# Patient Record
Sex: Male | Born: 1975 | Race: White | Hispanic: No | Marital: Married | Smoking: Never smoker
Health system: Southern US, Community
[De-identification: ages and names within clinical notes are randomized; demographics above are authoritative.]

## PROBLEM LIST (undated history)

## (undated) DIAGNOSIS — F419 Anxiety disorder, unspecified: Secondary | ICD-10-CM

## (undated) DIAGNOSIS — M1990 Unspecified osteoarthritis, unspecified site: Secondary | ICD-10-CM

## (undated) DIAGNOSIS — E785 Hyperlipidemia, unspecified: Secondary | ICD-10-CM

## (undated) DIAGNOSIS — J189 Pneumonia, unspecified organism: Secondary | ICD-10-CM

## (undated) DIAGNOSIS — K219 Gastro-esophageal reflux disease without esophagitis: Secondary | ICD-10-CM

## (undated) HISTORY — DX: Unspecified osteoarthritis, unspecified site: M19.90

## (undated) HISTORY — DX: Pneumonia, unspecified organism: J18.9

## (undated) HISTORY — DX: Gastro-esophageal reflux disease without esophagitis: K21.9

## (undated) HISTORY — PX: OTHER SURGICAL HISTORY: SHX169

## (undated) HISTORY — DX: Hyperlipidemia, unspecified: E78.5

## (undated) HISTORY — DX: Anxiety disorder, unspecified: F41.9

---

## 2005-10-25 ENCOUNTER — Ambulatory Visit: Payer: Self-pay | Admitting: Family Medicine

## 2006-02-28 ENCOUNTER — Ambulatory Visit: Payer: Self-pay | Admitting: Family Medicine

## 2006-05-01 ENCOUNTER — Ambulatory Visit: Payer: Self-pay | Admitting: Family Medicine

## 2006-05-01 LAB — CONVERTED CEMR LAB: H Pylori IgG: NEGATIVE

## 2006-05-21 DIAGNOSIS — F410 Panic disorder [episodic paroxysmal anxiety] without agoraphobia: Secondary | ICD-10-CM | POA: Insufficient documentation

## 2006-05-21 HISTORY — DX: Panic disorder (episodic paroxysmal anxiety): F41.0

## 2006-05-21 HISTORY — DX: Other disorders of bilirubin metabolism: E80.6

## 2006-05-22 ENCOUNTER — Encounter (INDEPENDENT_AMBULATORY_CARE_PROVIDER_SITE_OTHER): Payer: Self-pay | Admitting: Family Medicine

## 2006-05-22 ENCOUNTER — Encounter: Payer: Self-pay | Admitting: Family Medicine

## 2006-05-22 ENCOUNTER — Ambulatory Visit: Payer: Self-pay | Admitting: Family Medicine

## 2006-05-22 DIAGNOSIS — K219 Gastro-esophageal reflux disease without esophagitis: Secondary | ICD-10-CM

## 2006-05-22 HISTORY — DX: Gastro-esophageal reflux disease without esophagitis: K21.9

## 2006-06-01 ENCOUNTER — Encounter: Payer: Self-pay | Admitting: Internal Medicine

## 2006-08-21 ENCOUNTER — Ambulatory Visit: Payer: Self-pay | Admitting: Family Medicine

## 2006-08-21 DIAGNOSIS — R1031 Right lower quadrant pain: Secondary | ICD-10-CM

## 2006-08-21 DIAGNOSIS — N453 Epididymo-orchitis: Secondary | ICD-10-CM

## 2006-08-21 HISTORY — DX: Right lower quadrant pain: R10.31

## 2006-08-21 HISTORY — DX: Epididymo-orchitis: N45.3

## 2006-08-21 LAB — CONVERTED CEMR LAB
Blood in Urine, dipstick: NEGATIVE
Glucose, Urine, Semiquant: NEGATIVE
Ketones, urine, test strip: NEGATIVE
WBC Urine, dipstick: NEGATIVE

## 2006-08-24 ENCOUNTER — Ambulatory Visit: Payer: Self-pay | Admitting: Internal Medicine

## 2006-08-27 ENCOUNTER — Telehealth (INDEPENDENT_AMBULATORY_CARE_PROVIDER_SITE_OTHER): Payer: Self-pay | Admitting: *Deleted

## 2006-12-25 ENCOUNTER — Telehealth (INDEPENDENT_AMBULATORY_CARE_PROVIDER_SITE_OTHER): Payer: Self-pay | Admitting: *Deleted

## 2007-01-02 ENCOUNTER — Telehealth (INDEPENDENT_AMBULATORY_CARE_PROVIDER_SITE_OTHER): Payer: Self-pay | Admitting: *Deleted

## 2007-02-04 ENCOUNTER — Telehealth (INDEPENDENT_AMBULATORY_CARE_PROVIDER_SITE_OTHER): Payer: Self-pay | Admitting: *Deleted

## 2007-02-05 ENCOUNTER — Ambulatory Visit: Payer: Self-pay | Admitting: Family Medicine

## 2007-10-04 ENCOUNTER — Ambulatory Visit: Payer: Self-pay | Admitting: Family Medicine

## 2007-10-04 DIAGNOSIS — Z8669 Personal history of other diseases of the nervous system and sense organs: Secondary | ICD-10-CM

## 2007-10-04 HISTORY — DX: Personal history of other diseases of the nervous system and sense organs: Z86.69

## 2007-10-17 ENCOUNTER — Encounter (INDEPENDENT_AMBULATORY_CARE_PROVIDER_SITE_OTHER): Payer: Self-pay | Admitting: *Deleted

## 2007-10-17 ENCOUNTER — Telehealth (INDEPENDENT_AMBULATORY_CARE_PROVIDER_SITE_OTHER): Payer: Self-pay | Admitting: *Deleted

## 2007-10-21 ENCOUNTER — Encounter (INDEPENDENT_AMBULATORY_CARE_PROVIDER_SITE_OTHER): Payer: Self-pay | Admitting: *Deleted

## 2008-03-17 ENCOUNTER — Telehealth (INDEPENDENT_AMBULATORY_CARE_PROVIDER_SITE_OTHER): Payer: Self-pay | Admitting: *Deleted

## 2008-03-18 ENCOUNTER — Ambulatory Visit: Payer: Self-pay | Admitting: Family Medicine

## 2008-03-18 DIAGNOSIS — E782 Mixed hyperlipidemia: Secondary | ICD-10-CM

## 2008-03-18 DIAGNOSIS — E785 Hyperlipidemia, unspecified: Secondary | ICD-10-CM

## 2008-03-18 HISTORY — DX: Mixed hyperlipidemia: E78.2

## 2008-03-18 HISTORY — DX: Hyperlipidemia, unspecified: E78.5

## 2008-04-01 ENCOUNTER — Encounter: Payer: Self-pay | Admitting: Family Medicine

## 2008-04-08 ENCOUNTER — Encounter (INDEPENDENT_AMBULATORY_CARE_PROVIDER_SITE_OTHER): Payer: Self-pay | Admitting: *Deleted

## 2008-04-22 ENCOUNTER — Telehealth: Payer: Self-pay | Admitting: Family Medicine

## 2008-05-08 ENCOUNTER — Encounter: Payer: Self-pay | Admitting: Family Medicine

## 2008-05-21 ENCOUNTER — Ambulatory Visit: Payer: Self-pay | Admitting: Family Medicine

## 2008-05-26 ENCOUNTER — Encounter: Payer: Self-pay | Admitting: Family Medicine

## 2008-08-11 ENCOUNTER — Telehealth (INDEPENDENT_AMBULATORY_CARE_PROVIDER_SITE_OTHER): Payer: Self-pay | Admitting: *Deleted

## 2008-11-02 ENCOUNTER — Ambulatory Visit: Payer: Self-pay | Admitting: Family Medicine

## 2009-03-12 ENCOUNTER — Ambulatory Visit: Payer: Self-pay | Admitting: Internal Medicine

## 2009-03-12 DIAGNOSIS — F411 Generalized anxiety disorder: Secondary | ICD-10-CM

## 2009-03-12 DIAGNOSIS — M5441 Lumbago with sciatica, right side: Secondary | ICD-10-CM | POA: Insufficient documentation

## 2009-03-12 DIAGNOSIS — M549 Dorsalgia, unspecified: Secondary | ICD-10-CM | POA: Insufficient documentation

## 2009-03-12 HISTORY — DX: Lumbago with sciatica, right side: M54.41

## 2009-03-12 HISTORY — DX: Generalized anxiety disorder: F41.1

## 2009-03-12 HISTORY — DX: Dorsalgia, unspecified: M54.9

## 2009-03-12 LAB — CONVERTED CEMR LAB
Bilirubin Urine: NEGATIVE
Blood in Urine, dipstick: NEGATIVE
Ketones, urine, test strip: NEGATIVE
Nitrite: NEGATIVE
Urobilinogen, UA: 0.2
WBC Urine, dipstick: NEGATIVE
pH: 7

## 2009-04-09 ENCOUNTER — Ambulatory Visit: Payer: Self-pay | Admitting: Internal Medicine

## 2009-04-09 ENCOUNTER — Ambulatory Visit: Payer: Self-pay | Admitting: Diagnostic Radiology

## 2009-04-09 ENCOUNTER — Emergency Department (HOSPITAL_BASED_OUTPATIENT_CLINIC_OR_DEPARTMENT_OTHER): Admission: EM | Admit: 2009-04-09 | Discharge: 2009-04-09 | Payer: Self-pay | Admitting: Emergency Medicine

## 2009-04-09 DIAGNOSIS — R109 Unspecified abdominal pain: Secondary | ICD-10-CM

## 2009-04-09 HISTORY — DX: Unspecified abdominal pain: R10.9

## 2009-04-12 ENCOUNTER — Telehealth: Payer: Self-pay | Admitting: Internal Medicine

## 2009-05-28 ENCOUNTER — Ambulatory Visit: Payer: Self-pay | Admitting: Family Medicine

## 2009-05-28 DIAGNOSIS — S91309A Unspecified open wound, unspecified foot, initial encounter: Secondary | ICD-10-CM | POA: Insufficient documentation

## 2009-05-28 HISTORY — DX: Unspecified open wound, unspecified foot, initial encounter: S91.309A

## 2009-05-31 ENCOUNTER — Telehealth (INDEPENDENT_AMBULATORY_CARE_PROVIDER_SITE_OTHER): Payer: Self-pay | Admitting: *Deleted

## 2009-11-25 ENCOUNTER — Ambulatory Visit: Payer: Self-pay | Admitting: Family Medicine

## 2009-12-17 ENCOUNTER — Telehealth: Payer: Self-pay | Admitting: Family Medicine

## 2010-02-06 LAB — CONVERTED CEMR LAB
ALT: 41 units/L (ref 0–53)
ALT: 44 units/L (ref 0–53)
AST: 29 units/L (ref 0–37)
Albumin: 4.2 g/dL (ref 3.5–5.2)
BUN: 10 mg/dL (ref 6–23)
BUN: 11 mg/dL (ref 6–23)
Basophils Absolute: 0 10*3/uL (ref 0.0–0.1)
Basophils Relative: 0.3 % (ref 0.0–3.0)
Basophils Relative: 0.4 % (ref 0.0–3.0)
CO2: 29 meq/L (ref 19–32)
Chloride: 107 meq/L (ref 96–112)
Chloride: 109 meq/L (ref 96–112)
Cholesterol: 164 mg/dL (ref 0–200)
Cholesterol: 194 mg/dL (ref 0–200)
Creatinine, Ser: 1 mg/dL (ref 0.4–1.5)
Direct LDL: 103.5 mg/dL
Eosinophils Relative: 1.7 % (ref 0.0–5.0)
GFR calc Af Amer: 100 mL/min
GFR calc Af Amer: 111 mL/min
Glucose, Bld: 101 mg/dL — ABNORMAL HIGH (ref 70–99)
Glucose, Bld: 95 mg/dL (ref 70–99)
HDL: 26.9 mg/dL — ABNORMAL LOW (ref >39.0)
Hemoglobin: 15.9 g/dL (ref 13.0–17.0)
LDL Cholesterol: 101 mg/dL — ABNORMAL HIGH (ref 0–99)
MCHC: 33.7 g/dL (ref 30.0–36.0)
MCHC: 34.8 g/dL (ref 30.0–36.0)
MCV: 83.5 fL (ref 78.0–100.0)
Neutrophils Relative %: 59.5 % (ref 43.0–77.0)
Platelets: 209 10*3/uL (ref 150–400)
Potassium: 4 meq/L (ref 3.5–5.1)
RBC: 5.61 M/uL (ref 4.22–5.81)
RBC: 5.67 M/uL (ref 4.22–5.81)
RDW: 12.4 % (ref 11.5–14.6)
Sodium: 144 meq/L (ref 135–145)
TSH: 1.52 microintl units/mL (ref 0.35–5.50)
TSH: 1.74 microintl units/mL (ref 0.35–5.50)
Total Bilirubin: 1.4 mg/dL — ABNORMAL HIGH (ref 0.3–1.2)
Total CHOL/HDL Ratio: 7.1
Triglycerides: 182 mg/dL — ABNORMAL HIGH (ref 0–149)
Triglycerides: 309 mg/dL (ref 0–149)
VLDL: 36 mg/dL (ref 0–40)
VLDL: 62 mg/dL — ABNORMAL HIGH (ref 0–40)
Vitamin B-12: 315 pg/mL (ref 211–911)
WBC: 6.7 10*3/uL (ref 4.5–10.5)
WBC: 7.4 10*3/uL (ref 4.5–10.5)

## 2010-02-10 NOTE — Assessment & Plan Note (Signed)
Summary: ACUTE FOR BACK PAIN--PH   Vital Signs:  Patient profile:   35 year old male Height:      70 inches Weight:      225 pounds BMI:     32.40 Temp:     98.4 degrees F oral Resp:     15 per minute BP sitting:   130 / 80  (left arm)  Vitals Entered By: Doristine Devoid (March 12, 2009 4:24 PM) CC: lower back pain x3days    Primary Care Provider:  Laury Axon  CC:  lower back pain x3days .  History of Present Illness: Acute onset on sharp  stabbing pain @ R posterior , inferior chest after being supine approx 1 hr  03/08/2009 which improved with NSAIDS. Residual pain ,worse with thoracic movement. As of this am he has had sharp burning pain below L inferior post rib cage with radiation around to front, worse to palpation. PMH of similar pain on L 2-3 weeks w/o trigger. 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 ENT:  Denies decreased hearing, difficulty swallowing, hoarseness, and ringing in ears. CV:  Complains of shortness of breath with exertion; denies chest pain or discomfort, leg cramps with exertion, swelling of feet, and swelling of hands; Deconditioning related DOE. Resp:  Denies chest pain with inspiration, cough, coughing up blood, shortness of breath, and sputum productive. GI:  Complains of indigestion; denies abdominal pain, bloody stools, change in bowel habits, and dark tarry stools. GU:  Denies discharge, dysuria, and hematuria. Neuro:   Denies brief paralysis, numbness, tingling, and weakness; radicualr pain LUQ from back this am. Psych:  Complains of anxiety and panic attacks; Anxiety attack once daily & panic 1-2 X/month despite 200 mg of Zoloft. 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  Physical Exam  General:  ,in no acute distress; alert,appropriate and cooperative throughout examination Eyes:  No corneal or conjunctival inflammation noted.  Perrla.No icterus Mouth:  Oral mucosa and oropharynx without lesions or exudates.  Teeth in good repair. Mild pharyngeal erythema.   Chest Wall:  Slightly tender to palpation R inferolateral rib cage  Lungs:  Normal respiratory effort, chest expands symmetrically. Lungs are clear to auscultation, no crackles or wheezes. O2 sats 100% on RA Heart:  Normal rate and regular rhythm. S1 and S2 normal without gallop, murmur, click, rub . S4 Abdomen:  Bowel sounds positive,abdomen soft  but minimal tenderness LUQ without masses, organomegaly or hernias noted. Msk:  Slightly tender L upper flank Extremities:  No clubbing, cyanosis, edema, or deformity noted  Neurologic:  alert & oriented X3.   Skin:  Striae over abdomen ; no jaundice Cervical Nodes:  No lymphadenopathy noted Axillary Nodes:  No palpable lymphadenopathy Psych:  memory intact for recent and remote, normally interactive, but  subdued, yet he describes "medium level " anxiety .     Impression & Recommendations:  Problem # 1:  BACK PAIN (ICD-724.5) Radicular pain  @ ? T8 vs HH with referred pain; doubt kidney stone Orders: UA Dipstick w/o Micro (manual) (04540)  Problem # 2:  BENIGN POSITIONAL VERTIGO, HX OF (ICD-V12.49)  Problem # 3:  ANXIETY (ICD-300.00) due to fear of BPV, "what ifs" The following medications were removed from the medication list:    Alprazolam 0.25 Mg Tabs (Alprazolam) .Marland Kitchen... Prn    Klonopin 0.5 Mg Tabs (Clonazepam) .Marland Kitchen... 1 by mouth bid His updated medication list for this problem includes:    Zoloft 100 Mg Tabs  (Sertraline hcl) ..... 200mg  by mouth once daily    Cymbalta 60 Mg Cpep (Duloxetine hcl) .Marland Kitchen... 1 once daily    Diazepam 2 Mg Tabs (Diazepam) .Marland Kitchen... 1 every  8 hrs as needed  Problem # 4:  PANIC DISORDER (ICD-300.01)  The following medications were removed from the medication list:    Alprazolam 0.25 Mg Tabs (Alprazolam) .Marland Kitchen... Prn    Klonopin 0.5 Mg Tabs (Clonazepam) .Marland Kitchen... 1 by mouth bid His updated medication list for this problem includes:    Zoloft 100 Mg Tabs (Sertraline hcl) ..... 200mg  by mouth once daily    Cymbalta 60 Mg Cpep (Duloxetine hcl) .Marland Kitchen... 1 once daily    Diazepam 2 Mg Tabs (Diazepam) .Marland Kitchen... 1 every  8 hrs as needed  Complete Medication List: 1)  Zoloft 100 Mg Tabs (Sertraline hcl) .... 200mg  by mouth once daily 2)  Cymbalta 60 Mg Cpep (Duloxetine hcl) .Marland Kitchen.. 1 once daily 3)  Diazepam 2 Mg Tabs (Diazepam) .Marland Kitchen.. 1 every  8 hrs as needed  Patient Instructions: 1)  Wean Zoloft : 100 mg once daily X 1 week then 1/2 pill X 1 week then start Cymbalta titration.  2)  Take 400-600mg  of Ibuprofen (Advil, Motrin) with food every 4-6 hours as needed for relief of pain or comfort of fever.Strain urine over weekend. Consume LESS THAN 40 grams of sugar / day from foods & drinks with High Fructose Corn Syrup as #1,2 or #3 Prescriptions: DIAZEPAM 2 MG TABS (DIAZEPAM) 1 every  8 hrs as needed  #30 x 2   Entered and Authorized by:   Marga Melnick MD   Signed by:   Marga Melnick MD on 03/12/2009   Method used:   Print then Give to Patient   RxID:   [id number] CYMBALTA 60 MG CPEP (DULOXETINE HCL) 1 once daily  #30 x 5   Entered and Authorized by:   Marga Melnick MD   Signed by:   Marga Melnick MD on 03/12/2009   Method used:   Print then Give to Patient   RxID:   [id number]   Laboratory Results   Urine Tests    Routine Urinalysis   Glucose: negative   (Normal Range: Negative) Bilirubin: negative   (Normal Range: Negative) Ketone: negative   (Normal Range:  Negative) Spec. Gravity: 1.015   (Normal Range: 1.003-1.035) Blood: negative   (Normal Range: Negative) pH: 7.0   (Normal Range: 5.0-8.0) Protein: negative   (Normal Range: Negative) Urobilinogen: 0.2   (Normal Range: 0-1) Nitrite: negative   (Normal Range: Negative) Leukocyte Esterace: negative   (Normal Range: Negative)

## 2010-02-10 NOTE — Progress Notes (Signed)
Summary: med not working  Phone Note Call from Patient Call back at [phone]   Caller: Patient Summary of Call: Pt states valium is making him very drowsy causing his head to feel heavy. Pt would like to go back on alprazolam. [facility] [address]. Pls Advise.........Marland KitchenFelecia Deloach CMA  December 17, 2009 8:25 AM   Follow-up for Phone Call        how often does pt need to take something like xanax/ valium---if more than 1x a day---- Klonopin would be better.  if only occassionally < 1xa day then xanax refill x1 is fine. Follow-up by: Loreen Freud DO,  December 17, 2009 8:55 AM  Additional Follow-up for Phone Call Additional follow up Details #1::        Pt states that he is only taking med once a day and sometime not at all, only when needed. Pt aware Rx sent to pharmacy....Marland KitchenMarland KitchenFelecia Deloach CMA  December 17, 2009 10:33 AM     New/Updated Medications: ALPRAZOLAM 0.25 MG TABS (ALPRAZOLAM) Take 1 tab as needed Prescriptions: ALPRAZOLAM 0.25 MG TABS (ALPRAZOLAM) Take 1 tab as needed  #30 x 0   Entered by:   Jeremy Johann CMA   Authorized by:   Loreen Freud DO   Signed by:   Jeremy Johann CMA on 12/17/2009   Method used:   Printed then faxed to ...       [facility] [id number] (retail)       [address]       [place], [place]  [address]       Ph: [phone] or [phone]       Fax: [phone]   RxID:   [id number]

## 2010-02-10 NOTE — Progress Notes (Signed)
Summary: Wound Culture  Phone Note Outgoing Call   Call placed by: Army Fossa CMA,  May 31, 2009 4:21 PM Reason for Call: Discuss lab or test results Summary of Call: Wound Culture, LMTCB:  + strep----  is wound healing? Signed by Loreen Freud DO on 05/31/2009 at 3:02 PM  Follow-up for Phone Call        [facility]. Army Fossa CMA  Jun 01, 2009 2:42 PM   Additional Follow-up for Phone Call Additional follow up Details #1::        LMtCB. Army Fossa CMA  Jun 02, 2009 10:21 AM     Additional Follow-up for Phone Call Additional follow up Details #2::    Pt states wound is healing well. Army Fossa CMA  Jun 02, 2009 1:12 PM

## 2010-02-10 NOTE — Assessment & Plan Note (Signed)
Summary: cut on foot/cbs   Vital Signs:  Patient profile:   35 year old male Weight:      225 pounds Pulse rate:   86 / minute Pulse rhythm:   regular BP sitting:   122 / 80  (left arm) Cuff size:   large  Vitals Entered By: Army Fossa CMA (May 28, 2009 1:19 PM) CC: Pt here he was scratching his foot x 1 week ago, now spot that he scratched looks infected x 2-3 days.    History of Present Illness: Pt here c/o wound on top of Left foot that started out like bumps that were itchy and now scratched open and infected.   Pt wife put bread and Milk? on it.  Pt states it seems to be better  Allergies (verified): No Known Drug Allergies  Physical Exam  General:  Well-developed,well-nourished,in no acute distress; alert,appropriate and cooperative throughout examination Skin:  L foot--base ankle and top of foot 2 ulcerations with surrounding errythema and serousanginous fluid Psych:  Oriented X3 and normally interactive.     Impression & Recommendations:  Problem # 1:  OPEN WOUND FT NO TOE ALONE WITHOUT MENTION COMP (ICD-892.0) keflex 500 two times a day for 10 days tetanus utd per pt Orders: T-Culture, Wound ([id number])  Complete Medication List: 1)  Cymbalta 60 Mg Cpep (Duloxetine hcl) .Marland Kitchen.. 1 once daily 2)  Diazepam 2 Mg Tabs (Diazepam) .Marland Kitchen.. 1 every  8 hrs as needed 3)  Keflex 500 Mg Caps (Cephalexin) .Marland Kitchen.. 1 by mouth two times a day Prescriptions: KEFLEX 500 MG CAPS (CEPHALEXIN) 1 by mouth two times a day  #20 x 0   Entered and Authorized by:   Loreen Freud DO   Signed by:   Loreen Freud DO on 05/28/2009   Method used:   Electronically to        [facility] (retail)       [address]       [place], [place]  [address]       Ph: [phone] or [phone]       Fax: [phone]   RxID:   [id number]

## 2010-02-10 NOTE — Assessment & Plan Note (Signed)
Summary: abdominal/kdc   Vital Signs:  Patient profile:   35 year old male Height:      70 inches Weight:      219.6 pounds Temp:     98.8 degrees F BP sitting:   120 / 80  Vitals Entered By: Shary Decamp (April 09, 2009 2:03 PM) CC: abd pain, epigastric x 2-3 days +nausea, diarrhea, low grade fever   History of Present Illness: 2 days  history of upper abdominal  pain  pain is steady, worse when he eats it is located in the epigastrium, sometimes goes to the lower abdomen he had fever with the onset of symptoms but not since then. He started to take two Motrin his every 6 hours for the last two days trying to help  the pain.  ROS: +  for postprandial  nausea , no vomiting has developed  loose stools for the last two days. stools are nonbloody that he saw drops of bloating the toilet paper mild heartburn on and off no dysuria or hematuria no history  of abdominal surgeries before   Allergies: No Known Drug Allergies  Past History:  Past Medical History: Reviewed history from 03/12/2009 and no changes required. GERD Anxiety Hyperlipidemia  Past Surgical History: Reviewed history from 03/12/2009 and no changes required. 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   Eyes:  not pale or jaundice  Lungs:  normal respiratory effort, no intercostal retractions, no accessory muscle use, and normal breath sounds.   Heart:  normal rate, regular rhythm, no murmur, and no gallop.   Abdomen:  soft.  quite tender  at the  epigastric area, right upper quadrant and less tender the RLQ.  No mass or rebound. + bowel sounds   Impression & Recommendations:  Problem # 1:   ABDOMINAL PAIN, UPPER (ICD-789.09) two days history of upper abdominal pain DDX includes gastritis, PUD cholelithiasis atypical appendicitis  (symptoms may be masked by heavy  use of Motrin in the last two days) PLAN: ER for further eval discontinue Motrin Will discuss with ER if needed, we can arrange a GI referral on Monday  Problem # 2:  addendum ER records reviewed CBC- lipase - CT abd normal  Sodium (NA)                              146        h      135-145          mEq/L  Potassium (K)                            3.7               3.5-5.1          mEq/L  Creatinine                               1.1               0.4-1.5          mg/dL Bilirubin, Total  1.7        h      0.3-1.2          mg/dL  Alkaline Phosphatase                     53                39-117           U/L  SGOT (AST)                               35                0-37             U/L  SGPT (ALT)                               51                0-53             U/L  Total  Protein                           8.4        h      6.0-8.3          g/dL  Albumin-Blood                            4.5               3.5-5.2          g/dL  Calcium                                  9.6               8.4-10.5         mg/dL plan:  check on him Monday Omeprazole two times a day OV 1 week if symptoms severe or persistent refer to GI (?EGD) and u\GB u/s Hallel Denherder E. Suheyla Mortellaro MD  April 10, 2009 12:24 PM   Complete Medication List: 1)  Zoloft 100 Mg Tabs (Sertraline hcl) .... 200mg  by mouth once daily 2)  Cymbalta 60 Mg Cpep (Duloxetine hcl) .Marland Kitchen.. 1 once daily 3)  Diazepam 2 Mg Tabs (Diazepam) .Marland Kitchen.. 1 every  8 hrs as needed

## 2010-02-10 NOTE — Progress Notes (Signed)
Summary: LMOM 4/4, 4/5, 4/7, pt never returned call  Phone Note Outgoing Call   Summary of Call: please check on the patient he needs to start Omeprazole two times a day OV 1 week if symptoms severe or persistent will need referal  to GI (?EGD) and  GB u/s Maurisha Mongeau E. Aalyah Mansouri MD  April 12, 2009 8:44 AM  [facility] for pt to return call Shary Decamp  April 12, 2009 5:27 PM [facility] for pt to return call Shary Decamp  April 13, 2009 5:06 PM [facility] for pt to return call Shary Decamp  April 15, 2009 3:59 PM

## 2010-02-10 NOTE — Assessment & Plan Note (Signed)
Summary: anxiety meds not working as well as he wants///sph  Flu Vaccine Consent Questions     Do you have a history of severe allergic reactions to this vaccine? no    Any prior history of allergic reactions to egg and/or gelatin? no    Do you have a sensitivity to the preservative Thimersol? no    Do you have a past history of Guillan-Barre Syndrome? no    Do you currently have an acute febrile illness? no    Have you ever had a severe reaction to latex? no    Vaccine information given and explained to patient? yes    Are you currently pregnant? no    Lot Number:[id number]   Exp Date:07/09/2010   Site Given  Left Deltoid IM    Vital Signs:  Patient profile:   35 year old male Weight:      223 pounds Pulse rate:   102 / minute BP sitting:   110 / 76  (left arm)  Vitals Entered By: Doristine Devoid CMA (November 25, 2009 2:03 PM) CC: increase med or change dose doesn't feel like medication is working as well   History of Present Illness: Pt here c/o increase in  anxiety.  He never took valium.  Pt has vertigo which correlates with panic and anxiety.  Current Medications (verified): 1)  Cymbalta 60 Mg Cpep (Duloxetine Hcl) .Marland Kitchen.. 1 Once Daily 2)  Diazepam 2 Mg Tabs (Diazepam) .Marland Kitchen.. 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 Lungs are clear to auscultation, no crackles or wheezes. Heart:  normal rate and no murmur.   Psych:  Oriented X3, normally interactive, good eye contact, not anxious appearing, and not depressed appearing.     Impression & Recommendations:  Problem # 1:  ANXIETY (ICD-300.00)  His updated medication list for this problem includes:    Cymbalta 60 Mg Cpep (Duloxetine hcl) .Marland Kitchen... 1 once daily    Diazepam 2 Mg Tabs (Diazepam) .Marland Kitchen... 1 every  8 hrs as needed  Discussed medication use and relaxation techniques.   Complete Medication List: 1)  Cymbalta 60 Mg Cpep (Duloxetine hcl) .Marland Kitchen.. 1 once daily 2)  Diazepam 2 Mg Tabs (Diazepam) .Marland Kitchen.. 1 every  8 hrs as needed  Other Orders: Admin 1st Vaccine (16109) Flu Vaccine 44yrs + (60454)   Orders Added: 1)  Admin 1st Vaccine [90471] 2)  Flu Vaccine 57yrs + [90658] 3)  Est. Patient Level III [09811]

## 2010-03-30 LAB — COMPREHENSIVE METABOLIC PANEL
ALT: 51 U/L (ref 0–53)
Albumin: 4.5 g/dL (ref 3.5–5.2)
CO2: 27 mEq/L (ref 19–32)
GFR calc Af Amer: >60 mL/min (ref >60)
Glucose, Bld: 99 mg/dL (ref 70–99)
Potassium: 3.7 mEq/L (ref 3.5–5.1)
Total Bilirubin: 1.7 mg/dL — ABNORMAL HIGH (ref 0.3–1.2)

## 2010-03-30 LAB — URINALYSIS, ROUTINE W REFLEX MICROSCOPIC
Ketones, ur: NEGATIVE mg/dL
Specific Gravity, Urine: 1.021 (ref 1.005–1.030)
Urobilinogen, UA: 1 mg/dL (ref 0.0–1.0)
pH: 6 (ref 5.0–8.0)

## 2010-03-30 LAB — DIFFERENTIAL
Eosinophils Relative: 2 % (ref 0–5)
Lymphocytes Relative: 27 % (ref 12–46)
Lymphs Abs: 1.7 10*3/uL (ref 0.7–4.0)
Monocytes Absolute: 0.5 10*3/uL (ref 0.1–1.0)
Monocytes Relative: 7 % (ref 3–12)
Neutro Abs: 4.1 10*3/uL (ref 1.7–7.7)
Neutrophils Relative %: 63 % (ref 43–77)

## 2010-03-30 LAB — CBC
HCT: 45 % (ref 39.0–52.0)
MCHC: 34.2 g/dL (ref 30.0–36.0)
Platelets: 227 10*3/uL (ref 150–400)
RDW: 12.3 % (ref 11.5–15.5)

## 2010-04-26 ENCOUNTER — Other Ambulatory Visit: Payer: Self-pay | Admitting: *Deleted

## 2010-04-26 ENCOUNTER — Other Ambulatory Visit: Payer: Self-pay | Admitting: Family Medicine

## 2010-04-26 DIAGNOSIS — R42 Dizziness and giddiness: Secondary | ICD-10-CM

## 2010-04-26 MED ORDER — ALPRAZOLAM 0.25 MG PO TABS: 0.2500 mg | ORAL_TABLET | ORAL | Status: AC | PRN

## 2010-04-26 MED ORDER — ALPRAZOLAM 0.25 MG PO TABS: 0.2500 mg | ORAL_TABLET | ORAL | Status: DC | PRN

## 2010-04-26 NOTE — Telephone Encounter (Signed)
Ok to refill x1 Ok to refer to [facility]

## 2010-04-26 NOTE — Telephone Encounter (Signed)
Pt left VM that he need refill ALPRAZOLAM 0.25 MG TABS, Pt also note that his vertigo has increased so he would like to be referred to ENT. Pt last seen on 11-25-09 for this. Last filled 12-17-09 #30. Please advise

## 2010-04-26 NOTE — Telephone Encounter (Signed)
Pt.notified

## 2010-05-24 ENCOUNTER — Encounter: Payer: Self-pay | Admitting: Internal Medicine

## 2010-05-24 ENCOUNTER — Ambulatory Visit (INDEPENDENT_AMBULATORY_CARE_PROVIDER_SITE_OTHER): Payer: BC Managed Care – PPO | Admitting: Internal Medicine

## 2010-05-24 VITALS — BP 126/82 | HR 111 | Temp 98.3°F | Wt 220.0 lb

## 2010-05-24 DIAGNOSIS — B9789 Other viral agents as the cause of diseases classified elsewhere: Secondary | ICD-10-CM

## 2010-05-24 DIAGNOSIS — B349 Viral infection, unspecified: Secondary | ICD-10-CM | POA: Insufficient documentation

## 2010-05-24 HISTORY — DX: Viral infection, unspecified: B34.9

## 2010-05-24 LAB — CBC WITH DIFFERENTIAL/PLATELET
Eosinophils Relative: 2.9 % (ref 0.0–5.0)
HCT: 43.3 % (ref 39.0–52.0)
Lymphocytes Relative: 16.3 % (ref 12.0–46.0)
Lymphs Abs: 1.6 10*3/uL (ref 0.7–4.0)
MCV: 84.2 fl (ref 78.0–100.0)
Monocytes Relative: 10.8 % (ref 3.0–12.0)
Neutrophils Relative %: 69.6 % (ref 43.0–77.0)

## 2010-05-24 NOTE — Patient Instructions (Signed)
Rest, fluids , tylenol, motrin as needed  For cough, take Mucinex DM twice a day as needed  Call if no better in few days Call anytime if the symptoms are severe, you have high fever, a rash, severe headache

## 2010-05-24 NOTE — Progress Notes (Signed)
  Subjective:    Patient ID: Carl Lane, male    DOB: 05-04-1975, 35 y.o.   MRN: [id number]  HPI Started to feel sick 4 days ago, 3 days ago developed fever as high as 101.0, myalgias, generalized arthritis, moderate to severe headache (not the worst of his life). The headaches are located at the back of the head and neck. He is keeping his fever down with alternating Motrin and Tylenol. The headache persists. His wife has a similar syndrome 2 weeks ago that self resolve. She was checked for the flu and it was negative.  Past Medical History  Diagnosis Date  . GERD (gastroesophageal reflux disease)   . Anxiety   . Hyperlipidemia    Past Surgical History  Procedure Date  . 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 No sore throat No sinus congestion. Developed cough today with very small amount of sputum. Some nausea without vomiting.    Objective:   Physical Exam Alert, oriented, not febrile but slightly tachycardic. Nontoxic appearing. Ears tympanic membranes normal. Eyes: Not icteric or pale, no conjunctival lesions. Oropharynx without redness or discharge. Neck is full range of motion. Nontender to palpation. Lungs clear to consultation bilaterally. Cardiovascular tachycardic without a murmur Abdomen, nondistended, soft, mild diffuse tenderness (patient thinks due to recent cough) Extremities: Inspection on palpation of the hands and wrists normal, no edema. Skin: no rash noted Neurological exam: Speech, gait and motor exam normal..       Assessment & Plan:

## 2010-05-24 NOTE — Assessment & Plan Note (Signed)
Patient most likely has a viral syndrome. Due to the timing, even if this is the flu he won't qualify for antivirals. Plan: Conservative treatment. CBC,  if WBCs are extremely high we'll have to consider other diagnoses such as meningitis however the patient does not look toxic. See instructions

## 2010-05-25 ENCOUNTER — Telehealth: Payer: Self-pay | Admitting: *Deleted

## 2010-05-25 NOTE — Telephone Encounter (Signed)
Message left for patient to return my call.  

## 2010-05-25 NOTE — Telephone Encounter (Signed)
Pt is aware.  

## 2010-05-25 NOTE — Telephone Encounter (Signed)
Message copied by Army Fossa on Wed May 25, 2010  9:39 AM ------      Message from: Carl Lane      Created: Tue May 24, 2010  9:35 PM       Advise patient:      CBC normal, plan is the same

## 2010-06-08 ENCOUNTER — Other Ambulatory Visit: Payer: Self-pay | Admitting: Family Medicine

## 2010-06-08 NOTE — Telephone Encounter (Signed)
Last seen 05/24/10 and filled 04/26/10   please advise    KP

## 2010-06-09 MED ORDER — ALPRAZOLAM 0.25 MG PO TABS: 0.2500 mg | ORAL_TABLET | Freq: Every day | ORAL | Status: DC | PRN

## 2010-06-09 NOTE — Telephone Encounter (Signed)
Approved # 30 with 2 refills by Dr.Lowne from home.Marland Kitchen Rx reprinted so I can fax to the pharmacy      KP

## 2010-08-11 ENCOUNTER — Other Ambulatory Visit: Payer: Self-pay | Admitting: Internal Medicine

## 2010-09-12 ENCOUNTER — Other Ambulatory Visit: Payer: Self-pay | Admitting: Family Medicine

## 2010-09-13 NOTE — Telephone Encounter (Signed)
Last seen 05/24/10 by Dr. Drue Novel for Viral syndrome  and filled 08/11/10 please advise     KP

## 2010-09-16 ENCOUNTER — Ambulatory Visit (INDEPENDENT_AMBULATORY_CARE_PROVIDER_SITE_OTHER): Payer: BC Managed Care – PPO | Admitting: Family Medicine

## 2010-09-16 ENCOUNTER — Encounter: Payer: Self-pay | Admitting: Family Medicine

## 2010-09-16 VITALS — BP 116/82 | HR 78 | Temp 98.6°F | Wt 216.4 lb

## 2010-09-16 DIAGNOSIS — J329 Chronic sinusitis, unspecified: Secondary | ICD-10-CM

## 2010-09-16 MED ORDER — MOMETASONE FUROATE 50 MCG/ACT NA SUSP: 2.0000 | Freq: Every day | NASAL | Status: DC

## 2010-09-16 MED ORDER — CEFUROXIME AXETIL 500 MG PO TABS: 500.0000 mg | ORAL_TABLET | Freq: Two times a day (BID) | ORAL | Status: AC

## 2010-09-16 NOTE — Patient Instructions (Signed)

## 2010-09-16 NOTE — Progress Notes (Signed)
  Subjective:     Carl Lane is a 35 y.o. male who presents for evaluation of sinus pain. Symptoms include: congestion, cough, facial pain, nasal congestion and sinus pressure. Onset of symptoms was 3 days ago. Symptoms have been gradually worsening since that time. Past history is significant for no history of pneumonia or bronchitis. Patient is a non-smoker.  The following portions of the patient's history were reviewed and updated as appropriate: allergies, current medications, past family history, past medical history, past social history, past surgical history and problem list.  Review of Systems Pertinent items are noted in HPI.   Objective:    BP 116/82  Pulse 78  Temp(Src) 98.6 F (37 C) (Oral)  Wt 216 lb 6.4 oz (98.158 kg)  SpO2 98% General appearance: alert, cooperative, appears stated age and no distress Ears: normal TM's and external ear canals both ears Nose: green discharge, moderate congestion, turbinates red, swollen, sinus tenderness bilateral Throat: lips, mucosa, and tongue normal; teeth and gums normal Neck: no adenopathy, supple, symmetrical, trachea midline and thyroid not enlarged, symmetric, no tenderness/mass/nodules Lungs: clear to auscultation bilaterally Heart: S1, S2 normal Extremities: extremities normal, atraumatic, no cyanosis or edema    Assessment:    Acute bacterial sinusitis.    Plan:    Nasal saline sprays. Nasal steroids per medication orders. Ceftin per medication orders. f/u prn

## 2010-10-14 ENCOUNTER — Other Ambulatory Visit: Payer: Self-pay | Admitting: Family Medicine

## 2010-12-08 ENCOUNTER — Other Ambulatory Visit: Payer: Self-pay | Admitting: Family Medicine

## 2010-12-08 MED ORDER — ALPRAZOLAM 0.25 MG PO TABS: 0.2500 mg | ORAL_TABLET | Freq: Every day | ORAL | Status: DC | PRN

## 2010-12-08 NOTE — Telephone Encounter (Signed)
Last seen 09/16/10 and filled 06/09/10 # 30 with 2 refills. Please advise    KP

## 2010-12-12 ENCOUNTER — Encounter: Payer: Self-pay | Admitting: Family Medicine

## 2010-12-12 ENCOUNTER — Ambulatory Visit (INDEPENDENT_AMBULATORY_CARE_PROVIDER_SITE_OTHER): Payer: BC Managed Care – PPO | Admitting: Family Medicine

## 2010-12-12 VITALS — BP 116/76 | HR 123 | Temp 99.0°F | Wt 220.0 lb

## 2010-12-12 DIAGNOSIS — F411 Generalized anxiety disorder: Secondary | ICD-10-CM

## 2010-12-12 DIAGNOSIS — Z23 Encounter for immunization: Secondary | ICD-10-CM

## 2010-12-12 DIAGNOSIS — F419 Anxiety disorder, unspecified: Secondary | ICD-10-CM

## 2010-12-12 MED ORDER — CLONAZEPAM 0.5 MG PO TABS: 0.5000 mg | ORAL_TABLET | Freq: Two times a day (BID) | ORAL | Status: DC

## 2010-12-12 NOTE — Patient Instructions (Signed)

## 2010-12-13 NOTE — Progress Notes (Signed)
  Subjective:     Carl Lane is a 35 y.o. male who presents for follow up of anxiety disorder. Current symptoms: none. He denies current suicidal and homicidal ideation. He complains of the following side effects from the treatment: none.  The following portions of the patient's history were reviewed and updated as appropriate: allergies, current medications, past family history, past medical history, past social history, past surgical history and problem list.    Objective:    BP 116/76  Pulse 123  Temp(Src) 99 F (37.2 C) (Oral)  Wt 220 lb (99.791 kg)  SpO2 98%  General:  alert, cooperative, appears stated age and no distress  Affect/Behavior:  full facial expressions, good grooming, good insight, normal perception, normal reasoning, normal speech pattern and content and normal thought patterns -=      Assessment:    Anxiety Disorder - improving    Plan:    Medications: benzodiazepines klonopin and xanax prn and cymbalta. Handouts describing disease, natural history, and treatment were given to the patient. Instructed patient to contact office or on-call physician promptly should condition worsen or any new symptoms appear and provided on-call telephone numbers. IF THE PATIENT HAS ANY SUICIDAL OR HOMICIDAL IDEATIONS, CALL THE OFFICE, DISCUSS WITH A SUPPORT MEMBER, OR GO TO THE ER IMMEDIATELY. Patient was agreeable with this plan. Follow up: 3 months.

## 2010-12-26 ENCOUNTER — Other Ambulatory Visit: Payer: Self-pay | Admitting: Family Medicine

## 2011-01-20 ENCOUNTER — Telehealth: Payer: Self-pay

## 2011-01-20 DIAGNOSIS — S46219A Strain of muscle, fascia and tendon of other parts of biceps, unspecified arm, initial encounter: Secondary | ICD-10-CM

## 2011-01-20 MED ORDER — CLONAZEPAM 1 MG PO TABS: 1.0000 mg | ORAL_TABLET | Freq: Two times a day (BID) | ORAL | Status: AC | PRN

## 2011-01-20 NOTE — Telephone Encounter (Signed)
Msg from patient and he stated he has been taking the Klonopin x's a few weeks and it is not working. He wants to know if he could get something stronger, he said he was in a fog.  He also stated that he had a sports injury and he is now having a limtied ROM and decreased strength in the right Bicep. He wanted to know if he could get a referral to [facility]. Please advise     Carl Lane

## 2011-01-20 NOTE — Telephone Encounter (Signed)
Discussed with patient and he voiced understanding-Rx faxed and referral put in.    KP

## 2011-01-20 NOTE — Telephone Encounter (Signed)
Can increase klonopin to 1 mg and give him list of psych if needed Ok to refer to [facility]

## 2011-01-25 ENCOUNTER — Ambulatory Visit (INDEPENDENT_AMBULATORY_CARE_PROVIDER_SITE_OTHER): Payer: BC Managed Care – PPO | Admitting: Family Medicine

## 2011-01-25 ENCOUNTER — Encounter: Payer: Self-pay | Admitting: Family Medicine

## 2011-01-25 VITALS — BP 137/86 | HR 119 | Temp 97.5°F | Ht 70.0 in | Wt 215.0 lb

## 2011-01-25 DIAGNOSIS — S46219A Strain of muscle, fascia and tendon of other parts of biceps, unspecified arm, initial encounter: Secondary | ICD-10-CM

## 2011-01-25 DIAGNOSIS — S46819A Strain of other muscles, fascia and tendons at shoulder and upper arm level, unspecified arm, initial encounter: Secondary | ICD-10-CM

## 2011-01-25 DIAGNOSIS — S43499A Other sprain of unspecified shoulder joint, initial encounter: Secondary | ICD-10-CM

## 2011-01-25 NOTE — Patient Instructions (Signed)
You strained your right biceps muscle. Start physical therapy for up to 6 weeks, ok to transition to home program sooner. As a general rule ok to return to jiu jitsu when your strength is 80% of the opposite side doing arm curls. Compression sleeve if this helps alleviate your pain. Heat or ice 15 minutes at a time as needed for pain, ice after workouts and PT. Aleve 1-2 tabs twice a day with food for pain and inflammation. Follow up with me in 1 month to 6 weeks for reevaluation or as needed.

## 2011-01-26 ENCOUNTER — Encounter: Payer: Self-pay | Admitting: Family Medicine

## 2011-01-26 DIAGNOSIS — S46219A Strain of muscle, fascia and tendon of other parts of biceps, unspecified arm, initial encounter: Secondary | ICD-10-CM | POA: Insufficient documentation

## 2011-01-26 HISTORY — DX: Strain of muscle, fascia and tendon of other parts of biceps, unspecified arm, initial encounter: S46.219A

## 2011-01-26 NOTE — Assessment & Plan Note (Signed)
Right biceps strain - shoulder exam normal.  Pain location and testing indicates mid-substance biceps strain.  Will start physical therapy for this.  Compression sleeve did not feel helpful - can consider this in future.  NSAIDs, ice/heat as needed.  Return to jiu jitsu when strength 80% of left side.  See instructions for further.

## 2011-01-26 NOTE — Progress Notes (Signed)
Subjective:    Patient ID: Carl Lane, male    DOB: April 22, 1975, 36 y.o.   MRN: [id number]  PCP: Loreen Freud  HPI 36 yo M here for right arm pain.  Patient reports approximately 2 months ago while in jiu jitsu class he was placed in an [place] and later a reverse Gwynneth Aliment (former flexed elbow with forced shoulder IR, latter flexed elbow forced shoulder ER) and developed worsening soreness in lateral bicep. No swelling or bruising. Did not feel a pop during this. Since has had trouble with supination and elbow flexion activities. Painful at night. Not taking any medications or icing. No prior injuries. Is right handed.  Past Medical History  Diagnosis Date  . GERD (gastroesophageal reflux disease)   . Anxiety   . Hyperlipidemia     Current Outpatient Prescriptions on File Prior to Visit  Medication Sig Dispense Refill  . ALPRAZolam (XANAX) 0.25 MG tablet Take 1 tablet (0.25 mg total) by mouth daily as needed for sleep.  30 tablet  2  . clonazePAM (KLONOPIN) 1 MG tablet Take 1 tablet (1 mg total) by mouth 2 (two) times daily as needed for anxiety.  60 tablet  0  . CYMBALTA 60 MG capsule TAKE ONE CAPSULE BY MOUTH ONE TIME DAILY  30 capsule  5  . mometasone (NASONEX) 50 MCG/ACT nasal spray Place 2 sprays into the nose daily.  17 g  2    Past Surgical History  Procedure Date  . 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 Marital Status: Single    Spouse Name: N/A    Number of Children: 2  . Years of Education: N/A   Occupational History  . Tree surgeon    Social History Main Topics  . Smoking status: Never Smoker   . Smokeless tobacco: Not on file  . Alcohol Use: Yes  . Drug Use: Not on file  . Sexually Active: Not on file   Other Topics Concern  . Not on file   Social History Narrative   Regular exercise- intermittent    Family History    Problem Relation Age of Onset  . Hypertension    . Hypertension Father   . Hyperlipidemia Father   . Lupus Sister   . Heart attack Neg Hx   . Diabetes Neg Hx   . Sudden death Neg Hx     BP 137/86  Pulse 119  Temp(Src) 97.5 F (36.4 C) (Oral)  Ht 5\' 10"  (1.778 m)  Wt 215 lb (97.523 kg)  BMI 30.85 kg/m2  Review of Systems See HPI above.    Objective:   Physical Exam Gen: NAD  R arm/shoulder: No swelling, ecchymoses.  No gross deformity. Mild TTP mid-portion lateral biceps. No TTP shoulder, proximal or distal biceps tendons. FROM. Strength 4+/5 with elbow flexion - painful; 5/5 with supination/pronation and elbow extension without pain. Negative Hawkins, Neers. Positive Speeds, Yergasons. Negative Empty can and resisted internal/external rotation with 5/5 strength. Negative apprehension. NV intact distally.    Assessment & Plan:  1. Right biceps strain - shoulder exam normal.  Pain location and testing indicates mid-substance biceps strain.  Will start physical therapy for this.  Compression sleeve did not feel helpful - can consider this in future.  NSAIDs, ice/heat as needed.  Return to jiu jitsu when strength 80% of left side.  See instructions for further.

## 2011-02-07 ENCOUNTER — Ambulatory Visit: Payer: BC Managed Care – PPO | Admitting: Physical Therapy

## 2011-03-13 IMAGING — CT CT ABD-PELV W/ CM
2 of 4 series · 16 of 46 positions shown, 18 images · IV contrast (agent unspecified)
Comparison: 08/24/2006

CLINICAL DATA: Epigastric and lower abdominal pain, diarrhea, dry
heaves

CT ABDOMEN AND PELVIS WITH CONTRAST
TECHNIQUE: Multidetector CT imaging of the abdomen and pelvis was
performed following the standard protocol during bolus
administration of intravenous contrast. Breast shield utilized.
Sagittal and coronal MPR images reconstructed from axial data set.
Contrast: 100 ml Umnipaque-NDD IV; water as oral contrast

[Series 3: abd/pelvis 5.0 b31f · axial · 0.89mm/px · z∈[-586,-106]mm · 13 of 106 slices shown, 15 images]
[im 5/106  soft-tissue]
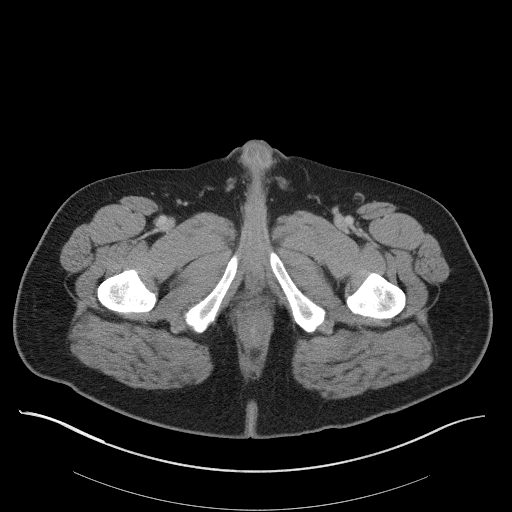
[im 5/106  bone]
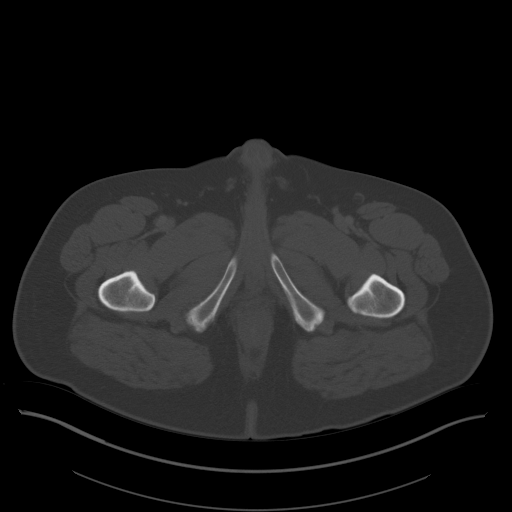
[im 13/106  soft-tissue]
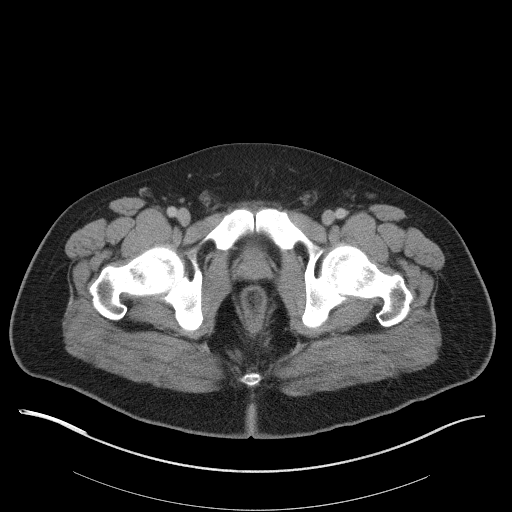
[im 22/106  soft-tissue]
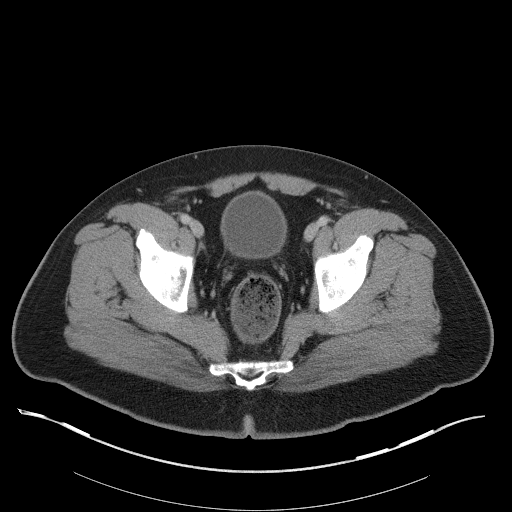
[im 30/106  soft-tissue]
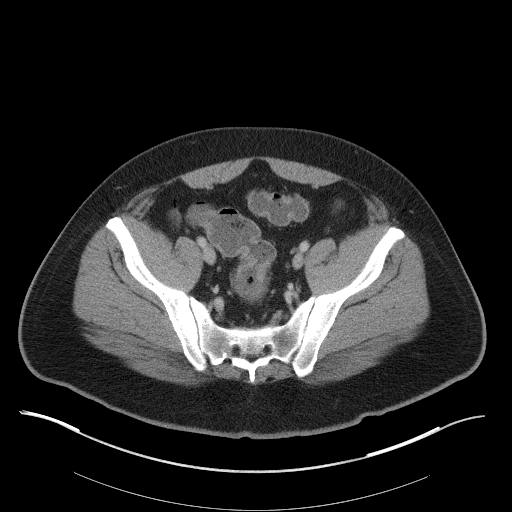
[im 38/106  soft-tissue]
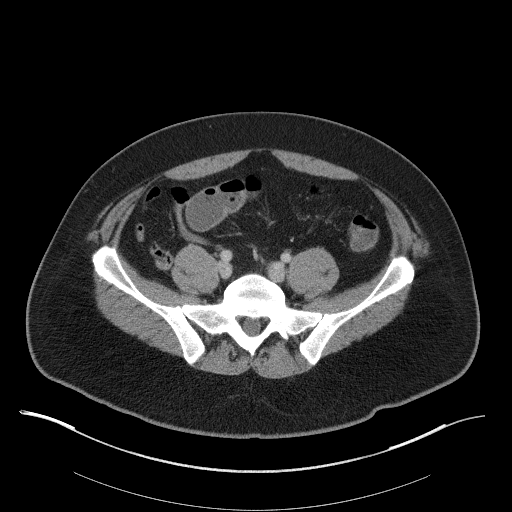
[im 47/106  soft-tissue]
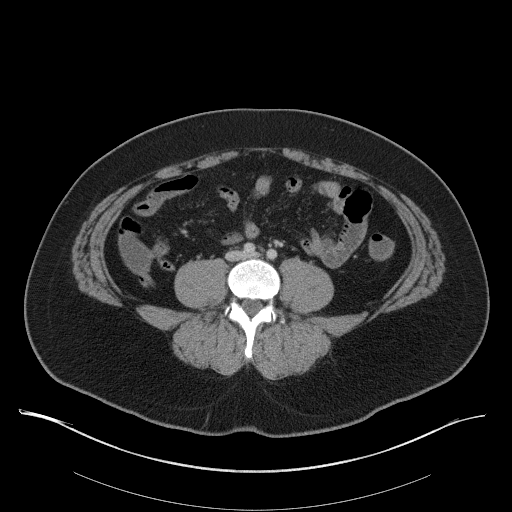
[im 55/106  soft-tissue]
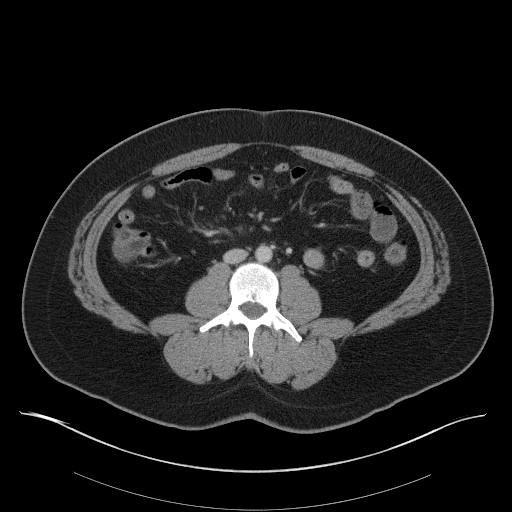
[im 59/106  soft-tissue]
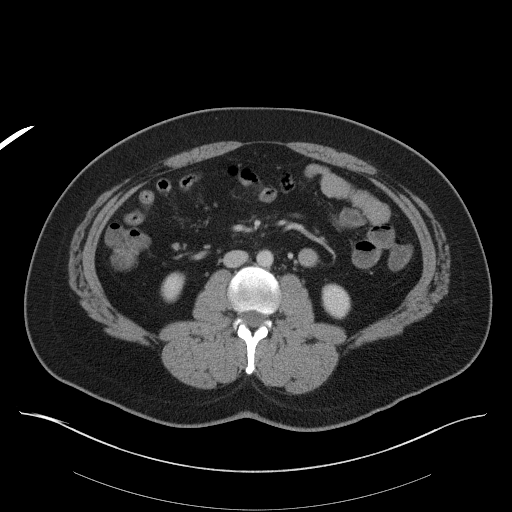
[im 68/106  soft-tissue]
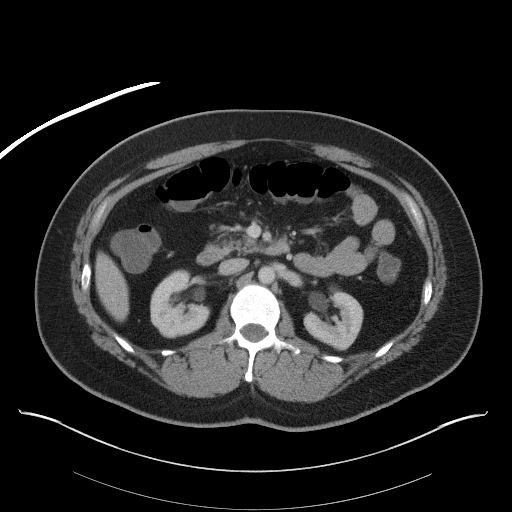
[im 68/106  bone]
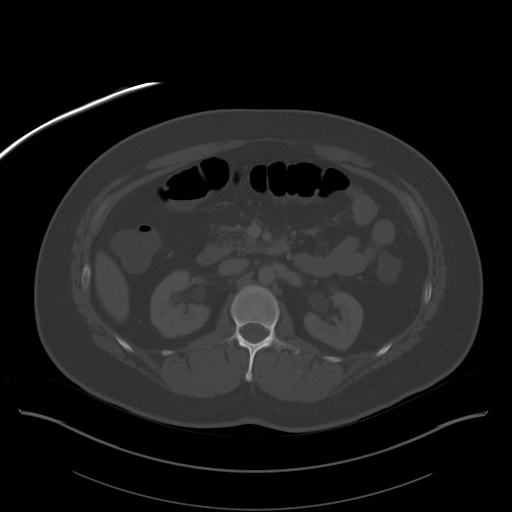
[im 76/106  soft-tissue]
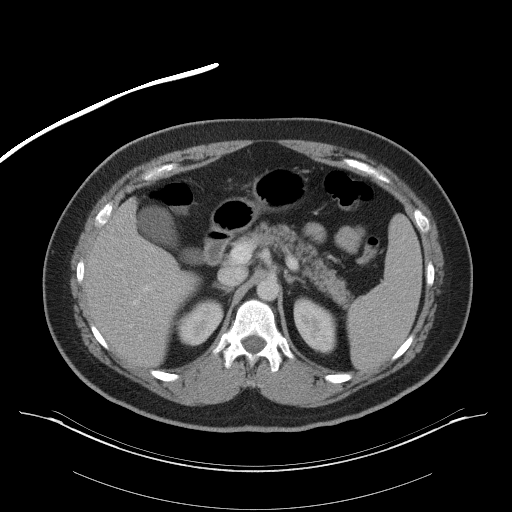
[im 85/106  soft-tissue]
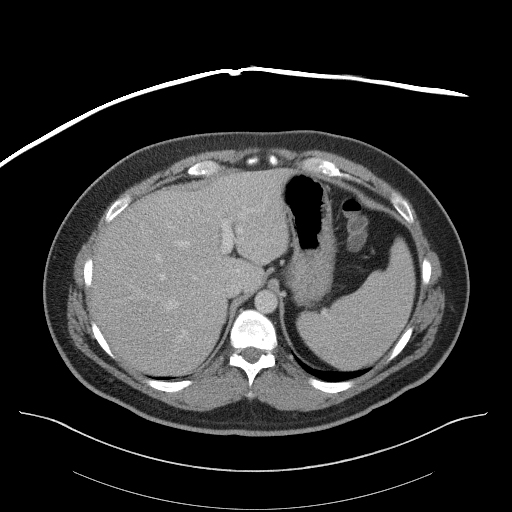
[im 93/106  soft-tissue]
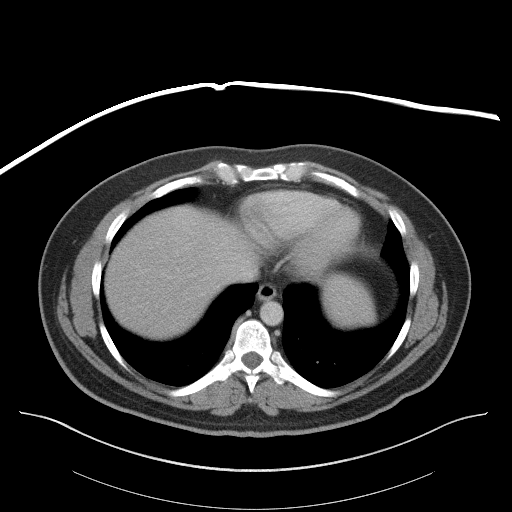
[im 101/106  soft-tissue]
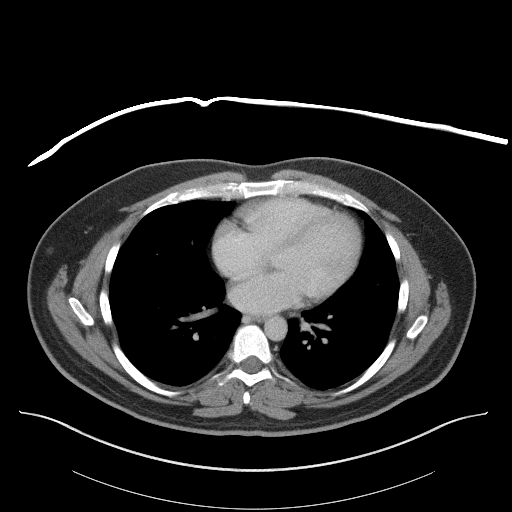

[Series 6: abd/pelvis 3.0 coronal · coronal · 0.74mm/px · 3 of 80 slices shown]
[im 27/80  soft-tissue]
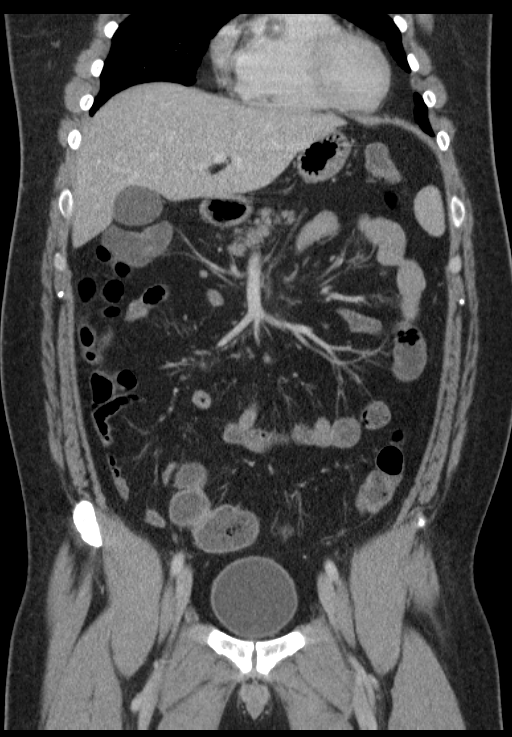
[im 36/80  soft-tissue]
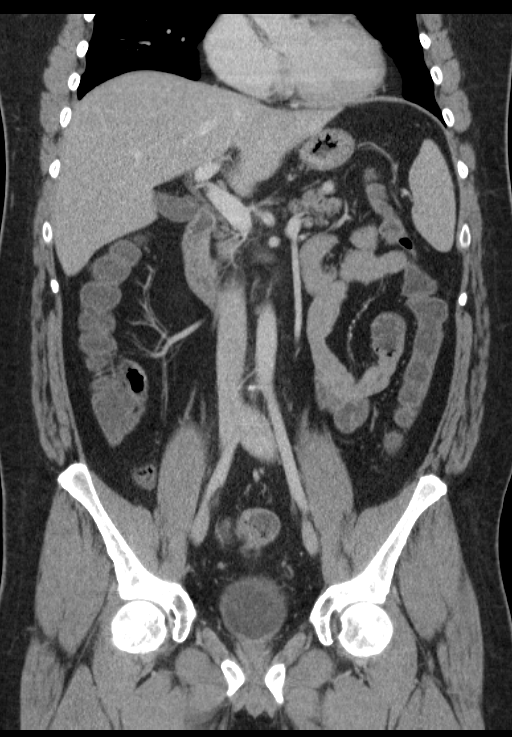
[im 44/80  soft-tissue]
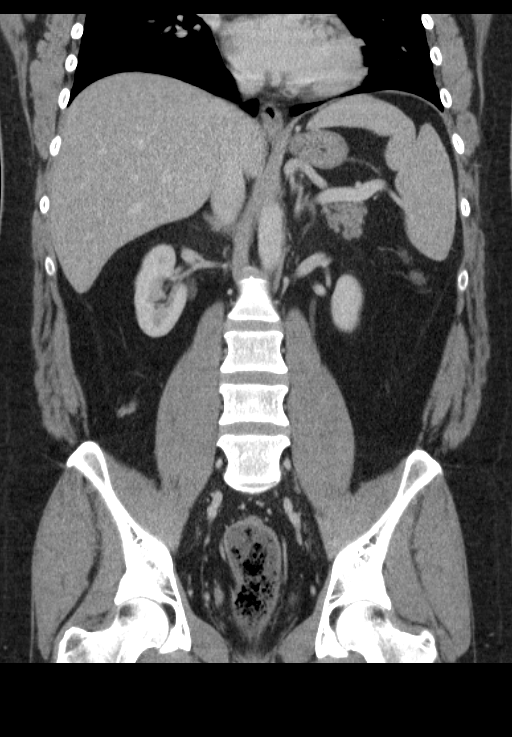

[16 of 46 positions shown; findings below may reference images not displayed]

FINDINGS: Lung bases clear.
Liver, spleen, pancreas, kidneys, and adrenal glands normal
appearance.
Stomach and bowel loops grossly unremarkable for exam lacking GI
contrast.
Normal appendix.
Unremarkable bladder and distal ureters.
No mass, adenopathy, free fluid or inflammatory process.
No acute bony findings.
IMPRESSION: No acute abnormalities.

## 2011-11-14 ENCOUNTER — Ambulatory Visit (INDEPENDENT_AMBULATORY_CARE_PROVIDER_SITE_OTHER): Payer: 59 | Admitting: Family Medicine

## 2011-11-14 ENCOUNTER — Ambulatory Visit (HOSPITAL_BASED_OUTPATIENT_CLINIC_OR_DEPARTMENT_OTHER)
Admission: RE | Admit: 2011-11-14 | Discharge: 2011-11-14 | Disposition: A | Discharge status: Home / Self Care | Payer: 59 | Source: Ambulatory Visit | Attending: Family Medicine | Admitting: Family Medicine

## 2011-11-14 ENCOUNTER — Encounter: Payer: Self-pay | Admitting: Family Medicine

## 2011-11-14 VITALS — BP 121/83 | Ht 70.0 in | Wt 215.0 lb

## 2011-11-14 DIAGNOSIS — M25529 Pain in unspecified elbow: Secondary | ICD-10-CM | POA: Insufficient documentation

## 2011-11-14 DIAGNOSIS — M25521 Pain in right elbow: Secondary | ICD-10-CM

## 2011-11-14 HISTORY — DX: Pain in right elbow: M25.521

## 2011-11-14 NOTE — Progress Notes (Signed)
  Subjective:    Patient ID: Carl Lane, male    DOB: 04-27-1975, 36 y.o.   MRN: [id number]  PCP: Dr. Laury Axon  HPI 36 yo M here for right elbow injury.  Patient reports on Saturday during a jiu jitsu tournament he sustained a couple injuries to right elbow. Sustained arm bar (elbow extension) and an americana (forced internal rotation of shoulder with adduction of flexed elbow). No acute injury or pop felt during this. No swelling or bruising. Iced following this, took tylenol. Went to martial arts class yesterday and was very painful lateral > medial elbow afterwards. States pain back down to a 2/10 today. No numbness/tingling. No prior elbow injuries (strained bicep earlier this year).  Past Medical History  Diagnosis Date  . GERD (gastroesophageal reflux disease)   . Anxiety   . Hyperlipidemia     Current Outpatient Prescriptions on File Prior to Visit  Medication Sig Dispense Refill  . ALPRAZolam (XANAX) 0.25 MG tablet Take 1 tablet (0.25 mg total) by mouth daily as needed for sleep.  30 tablet  2  . CYMBALTA 60 MG capsule TAKE ONE CAPSULE BY MOUTH ONE TIME DAILY  30 capsule  5  . mometasone (NASONEX) 50 MCG/ACT nasal spray Place 2 sprays into the nose daily.  17 g  2    Past Surgical History  Procedure Date  . 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 Marital Status: Married    Spouse Name: N/A    Number of Children: 2  . Years of Education: N/A   Occupational History  . Tree surgeon    Social History Main Topics  . Smoking status: Never Smoker   . Smokeless tobacco: Not on file  . Alcohol Use: Yes  . Drug Use: Not on file  . Sexually Active: Not on file   Other Topics Concern  . Not on file   Social History Narrative   Regular exercise- intermittent    Family History  Problem Relation Age of Onset  . Hypertension    . Hypertension  Father   . Hyperlipidemia Father   . Lupus Sister   . Heart attack Neg Hx   . Diabetes Neg Hx   . Sudden death Neg Hx     BP 121/83  Ht 5\' 10"  (1.778 m)  Wt 215 lb (97.523 kg)  BMI 30.85 kg/m2  Review of Systems See HPI above.    Objective:   Physical Exam Gen: NAD  R elbow: No gross deformity, swelling, bruising. TTP lateral supracondylar area and at brachioradialis insertion.  Minimal medial pain.  No epicondyle, olecranon, biceps tendon, other elbow TTP. FROM with 5/5 strength flexion/extension. Collateral ligaments intact. NVI distally.    Assessment & Plan:  1. Right elbow injury - radiographs negative for fracture or elbow effusion.  Consistent with brachioradialis strain, possible sprain of elbow.  Discussed relative rest, icing, compression sleeve, elevation, nsaids as needed.  Should resolve over next 2 weeks.  F/u prn.

## 2011-11-14 NOTE — Assessment & Plan Note (Signed)
radiographs negative for fracture or elbow effusion.  Consistent with brachioradialis strain, possible sprain of elbow.  Discussed relative rest, icing, compression sleeve, elevation, nsaids as needed.  Should resolve over next 2 weeks.  F/u prn.

## 2012-04-03 ENCOUNTER — Encounter: Payer: Self-pay | Admitting: Family Medicine

## 2012-04-03 ENCOUNTER — Ambulatory Visit (INDEPENDENT_AMBULATORY_CARE_PROVIDER_SITE_OTHER): Payer: 59 | Admitting: Family Medicine

## 2012-04-03 VITALS — BP 118/80 | HR 83 | Temp 98.5°F | Wt 229.0 lb

## 2012-04-03 DIAGNOSIS — J40 Bronchitis, not specified as acute or chronic: Secondary | ICD-10-CM

## 2012-04-03 MED ORDER — ALBUTEROL SULFATE (2.5 MG/3ML) 0.083% IN NEBU
2.5000 mg | INHALATION_SOLUTION | Freq: Once | RESPIRATORY_TRACT | Status: AC
  Administered 2012-04-03: 2.5 mg via RESPIRATORY_TRACT

## 2012-04-03 MED ORDER — ALBUTEROL SULFATE HFA 108 (90 BASE) MCG/ACT IN AERS: 2.0000 | INHALATION_SPRAY | Freq: Four times a day (QID) | RESPIRATORY_TRACT | Status: DC | PRN

## 2012-04-03 MED ORDER — GUAIFENESIN-CODEINE 100-10 MG/5ML PO SYRP: ORAL_SOLUTION | ORAL | Status: DC

## 2012-04-03 MED ORDER — AMOXICILLIN-POT CLAVULANATE 875-125 MG PO TABS: 1.0000 | ORAL_TABLET | Freq: Two times a day (BID) | ORAL | Status: DC

## 2012-04-03 MED ORDER — BECLOMETHASONE DIPROPIONATE 40 MCG/ACT IN AERS: 2.0000 | INHALATION_SPRAY | Freq: Two times a day (BID) | RESPIRATORY_TRACT | Status: DC

## 2012-04-03 NOTE — Addendum Note (Signed)
Addended by: Lelon Perla on: 04/03/2012 10:57 AM   Modules accepted: Orders

## 2012-04-03 NOTE — Progress Notes (Signed)
  Subjective:     Carl Lane is a 37 y.o. male here for evaluation of a cough. Onset of symptoms was 1 month ago. Symptoms have been gradually worsening since that time. The cough is productive and is aggravated by infection and reclining position. Associated symptoms include: chest pain, chills, fever, shortness of breath, sputum production and wheezing. Patient does not have a history of asthma. Patient does not have a history of environmental allergens. Patient has not traveled recently. Patient does not have a history of smoking. Patient has not had a previous chest x-ray. Patient has not had a PPD done.  The following portions of the patient's history were reviewed and updated as appropriate: allergies, current medications, past family history, past medical history, past social history, past surgical history and problem list.  Review of Systems Pertinent items are noted in HPI.    Objective:    Oxygen saturation 98% on room air BP 118/80  Pulse 83  Temp(Src) 98.5 F (36.9 C) (Oral)  Wt 229 lb (103.874 kg)  BMI 32.86 kg/m2  SpO2 98% General appearance: alert, cooperative, appears stated age and no distress Ears: normal TM's and external ear canals both ears Nose: green discharge, mild congestion, sinus tenderness bilateral Throat: abnormal findings: mild oropharyngeal erythema and pnd Neck: mild anterior cervical adenopathy, supple, symmetrical, trachea midline and thyroid not enlarged, symmetric, no tenderness/mass/nodules Lungs: diminished breath sounds bilaterally Heart: S1, S2 normal    Assessment:    Acute Bronchitis    Plan:    Antibiotics per medication orders. Antitussives per medication orders. Avoid exposure to tobacco smoke and fumes. B-agonist inhaler. Call if shortness of breath worsens, blood in sputum, change in character of cough, development of fever or chills, inability to maintain nutrition and hydration. Avoid exposure to tobacco smoke and  fumes. f/u prn

## 2012-04-03 NOTE — Patient Instructions (Signed)

## 2012-07-16 ENCOUNTER — Ambulatory Visit (HOSPITAL_BASED_OUTPATIENT_CLINIC_OR_DEPARTMENT_OTHER)
Admission: RE | Admit: 2012-07-16 | Discharge: 2012-07-16 | Disposition: A | Discharge status: Home / Self Care | Payer: 59 | Source: Ambulatory Visit | Attending: Family Medicine | Admitting: Family Medicine

## 2012-07-16 ENCOUNTER — Encounter: Payer: Self-pay | Admitting: Family Medicine

## 2012-07-16 ENCOUNTER — Ambulatory Visit (INDEPENDENT_AMBULATORY_CARE_PROVIDER_SITE_OTHER): Payer: 59 | Admitting: Family Medicine

## 2012-07-16 VITALS — BP 117/80 | HR 83 | Ht 70.0 in | Wt 212.0 lb

## 2012-07-16 DIAGNOSIS — M79609 Pain in unspecified limb: Secondary | ICD-10-CM | POA: Insufficient documentation

## 2012-07-16 DIAGNOSIS — S8990XA Unspecified injury of unspecified lower leg, initial encounter: Secondary | ICD-10-CM | POA: Insufficient documentation

## 2012-07-16 DIAGNOSIS — S99919A Unspecified injury of unspecified ankle, initial encounter: Secondary | ICD-10-CM | POA: Insufficient documentation

## 2012-07-16 DIAGNOSIS — M79672 Pain in left foot: Secondary | ICD-10-CM

## 2012-07-16 DIAGNOSIS — M7730 Calcaneal spur, unspecified foot: Secondary | ICD-10-CM | POA: Insufficient documentation

## 2012-07-16 DIAGNOSIS — S99922A Unspecified injury of left foot, initial encounter: Secondary | ICD-10-CM

## 2012-07-16 DIAGNOSIS — S99929A Unspecified injury of unspecified foot, initial encounter: Secondary | ICD-10-CM

## 2012-07-16 DIAGNOSIS — X58XXXA Exposure to other specified factors, initial encounter: Secondary | ICD-10-CM | POA: Insufficient documentation

## 2012-07-16 HISTORY — DX: Unspecified injury of left foot, initial encounter: S99.922A

## 2012-07-16 NOTE — Patient Instructions (Addendum)
Reassured patient - tylenol, nsaids, buddy taping, icing, activities as tolerated

## 2012-07-16 NOTE — Progress Notes (Signed)
Patient ID: Carl Lane, male   DOB: [birth year], 37 y.o.   MRN: [id number]  PCP: Loreen Freud, DO  Subjective:   HPI: Patient is a 37 y.o. male here for left toe injury.  Patient reports on 6/2 while in jujitsu class he got his 5th toe stuck in the mat and laterally deviated this. A lot of pain initially but could bend and extend this. Was able to walk but using shoes bothered this. Has been buddy taping, iced for first couple weeks. Some swelling and bruising. No prior injuries. Has not been evaluated for this yet.  Past Medical History  Diagnosis Date  . GERD (gastroesophageal reflux disease)   . Anxiety   . Hyperlipidemia     Current Outpatient Prescriptions on File Prior to Visit  Medication Sig Dispense Refill  . albuterol (PROAIR HFA) 108 (90 BASE) MCG/ACT inhaler Inhale 2 puffs into the lungs every 6 (six) hours as needed for wheezing.  1 Inhaler  0  . ALPRAZolam (XANAX) 0.25 MG tablet Take 1 tablet (0.25 mg total) by mouth daily as needed for sleep.  30 tablet  2  . amoxicillin-clavulanate (AUGMENTIN) 875-125 MG per tablet Take 1 tablet by mouth 2 (two) times daily.  20 tablet  0  . beclomethasone (QVAR) 40 MCG/ACT inhaler Inhale 2 puffs into the lungs 2 (two) times daily.  1 Inhaler  12  . CYMBALTA 60 MG capsule TAKE ONE CAPSULE BY MOUTH ONE TIME DAILY  30 capsule  5  . FLUoxetine (PROZAC) 20 MG tablet Take 60 mg by mouth daily.      . mometasone (NASONEX) 50 MCG/ACT nasal spray Place 2 sprays into the nose daily.  17 g  2   No current facility-administered medications on file prior to visit.    Past Surgical History  Procedure Laterality Date  . 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 Marital Status: Married    Spouse Name: N/A    Number of Children: 2  . Years of Education: N/A   Occupational History  . Tree surgeon    Social History  Main Topics  . Smoking status: Never Smoker   . Smokeless tobacco: Not on file  . Alcohol Use: Yes  . Drug Use: Not on file  . Sexually Active: Not on file   Other Topics Concern  . Not on file   Social History Narrative   Regular exercise- intermittent    Family History  Problem Relation Age of Onset  . Hypertension    . Hypertension Father   . Hyperlipidemia Father   . Lupus Sister   . Heart attack Neg Hx   . Diabetes Neg Hx   . Sudden death Neg Hx     BP 117/80  Pulse 83  Ht 5\' 10"  (1.778 m)  Wt 212 lb (96.163 kg)  BMI 30.42 kg/m2  Review of Systems: See HPI above.    Objective:  Physical Exam:  Gen: NAD  Left foot/ankle: Mild swelling left 5th toe compared to right.  No bruising, other deformity. FROM digits and ankle. TTP throughout 5th digit. Laxity with medial collateral ligament of PIP joint left 5th toe.   NV intact distally.    Assessment & Plan:  1. Left 5th toe injury - radiographs negative for a fracture.  Being 5 weeks out it's possible he  had a fracture that has largely healed radiographically.  Still with laxity of medial collateral ligament of this digit.  Advised to continue buddy taping.  Icing, tylenol, nsaids as needed.  Could consider a hard soled shoe.  Reassured.  F/u prn.

## 2012-07-16 NOTE — Assessment & Plan Note (Signed)
Left 5th toe injury - radiographs negative for a fracture.  Being 5 weeks out it's possible he had a fracture that has largely healed radiographically.  Still with laxity of medial collateral ligament of this digit.  Advised to continue buddy taping.  Icing, tylenol, nsaids as needed.  Could consider a hard soled shoe.  Reassured.  F/u prn.

## 2012-11-14 ENCOUNTER — Other Ambulatory Visit: Payer: Self-pay

## 2014-08-25 ENCOUNTER — Ambulatory Visit (INDEPENDENT_AMBULATORY_CARE_PROVIDER_SITE_OTHER): Payer: Managed Care, Other (non HMO) | Admitting: Family Medicine

## 2014-08-25 ENCOUNTER — Encounter: Payer: Self-pay | Admitting: Family Medicine

## 2014-08-25 VITALS — BP 121/79 | HR 77 | Ht 70.0 in | Wt 210.0 lb

## 2014-08-25 DIAGNOSIS — M545 Low back pain, unspecified: Secondary | ICD-10-CM

## 2014-08-25 NOTE — Patient Instructions (Signed)
You have a lumbar strain vs mild facet arthritis of your back. Aleve 2 tabs twice a day with food as needed for pain and inflammation. Consider prednisone, muscle relaxant, pain medication. Do home exercises and stretches as directed once or twice a day for next 6 weeks. Do some abdominal crunches as well. Call me if you want to do physical therapy. Strengthening of low back muscles, abdominal musculature are key for long term pain relief. Follow up with me in 6 weeks. No restrictions on activities.

## 2014-08-26 NOTE — Progress Notes (Signed)
PCP: Garnet Koyanagi, DO  Subjective:   HPI: Patient is a 39 y.o. male here for low back pain.  Patient reports over past few weeks he's had worsening low back pain into the left hip. Known hamstring tightness as well. Pain slight in the morning. Up to 3/10 currently. Worse in middle of night as well. No numbness/tingling. Worse bending to the side. No bowel/bladder dysfunction.  Past Medical History  Diagnosis Date  . GERD (gastroesophageal reflux disease)   . Anxiety   . Hyperlipidemia     Current Outpatient Prescriptions on File Prior to Visit  Medication Sig Dispense Refill  . albuterol (PROAIR HFA) 108 (90 BASE) MCG/ACT inhaler Inhale 2 puffs into the lungs every 6 (six) hours as needed for wheezing. 1 Inhaler 0  . FLUoxetine (PROZAC) 20 MG tablet Take 60 mg by mouth daily.    . mometasone (NASONEX) 50 MCG/ACT nasal spray Place 2 sprays into the nose daily. 17 g 2   No current facility-administered medications on file prior to visit.    Past Surgical History  Procedure Laterality Date  . 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 Marital Status: Married    Spouse Name: N/A  . Number of Children: 2  . Years of Education: N/A   Occupational History  . Art gallery manager    Social History Main Topics  . Smoking status: Never Smoker   . Smokeless tobacco: Not on file  . Alcohol Use: 0.0 oz/week    0 Standard drinks or equivalent per week  . Drug Use: Not on file  . Sexual Activity: Not on file   Other Topics Concern  . Not on file   Social History Narrative   Regular exercise- intermittent    Family History  Problem Relation Age of Onset  . Hypertension    . Hypertension Father   . Hyperlipidemia Father   . Lupus Sister   . Heart attack Neg Hx   . Diabetes Neg Hx   . Sudden death Neg Hx     BP 121/79 mmHg  Pulse 77  Ht 5\' 10"   (1.778 m)  Wt 210 lb (95.255 kg)  BMI 30.13 kg/m2  Review of Systems: See HPI above.    Objective:  Physical Exam:  Gen: NAD  Back: No gross deformity, scoliosis. TTP mildly just left of lumbar region.  No midline or bony TTP. FROM with pain on L > R side bending. Strength LEs 5/5 all muscle groups.   2+ MSRs in patellar and achilles tendons, equal bilaterally. Negative SLRs. Sensation intact to light touch bilaterally. Negative logroll bilateral hips Negative fabers and piriformis stretches.    Assessment & Plan:  1. Low back pain - consistent with lumbar strain vs mild facet DJD.  Start with home exercises, stretches which were shown today.  Aleve for pain/inflammation.  Let us know if he wants to do PT.  F/u in 6 weeks.

## 2014-08-26 NOTE — Assessment & Plan Note (Signed)
consistent with lumbar strain vs mild facet DJD.  Start with home exercises, stretches which were shown today.  Aleve for pain/inflammation.  Let us know if he wants to do PT.  F/u in 6 weeks.

## 2014-10-06 ENCOUNTER — Ambulatory Visit: Payer: Managed Care, Other (non HMO) | Admitting: Family Medicine

## 2014-10-23 ENCOUNTER — Encounter: Payer: Self-pay | Admitting: Family Medicine

## 2014-10-23 ENCOUNTER — Ambulatory Visit (INDEPENDENT_AMBULATORY_CARE_PROVIDER_SITE_OTHER): Payer: Managed Care, Other (non HMO) | Admitting: Family Medicine

## 2014-10-23 VITALS — BP 136/85 | HR 80 | Ht 70.0 in | Wt 212.0 lb

## 2014-10-23 DIAGNOSIS — M25511 Pain in right shoulder: Secondary | ICD-10-CM | POA: Diagnosis not present

## 2014-10-23 NOTE — Patient Instructions (Signed)
You have strains of your infraspinatus and subscapularis (rotator cuff muscles). Try to avoid painful activities (overhead activities, lifting with extended arm) as much as possible if pain exceeds 3/10 level. Aleve 2 tabs twice a day with food OR ibuprofen 3 tabs three times a day with food for pain and inflammation. Can take tylenol in addition to this. Consider physical therapy with transition to home exercise program. Do home exercise program with theraband and scapular stabilization exercises daily - these are very important for long term relief even if an injection was given.  3 sets of 10 once a day for next 4-6 weeks. Follow up with me in 1 month. Activities as tolerated otherwise.

## 2014-10-26 DIAGNOSIS — M25511 Pain in right shoulder: Secondary | ICD-10-CM

## 2014-10-26 HISTORY — DX: Pain in right shoulder: M25.511

## 2014-10-26 NOTE — Assessment & Plan Note (Signed)
2/2 strains of infraspinatus and subscapularis.  NSAIDs, tylenol if needed.  Icing.  Shown home exercises to do daily.  Relative rest discussed.  Consider ultrasound, injection, PT if not improving as expected.  F/u in 1 month.

## 2014-10-26 NOTE — Progress Notes (Signed)
PCP: Garnet Koyanagi, DO  Subjective:   HPI: Patient is a 39 y.o. male here for right shoulder pain.  Patient denies an acute injury or trauma. Recalls about a month ago he had some soreness in right bicep area that then improved. Some pain with pronation, supination when he had that. Was in a martial arts tournament this past weekend and, again, no injury but following this shoulder pain has worsened. Pain level 1/69 with certain motions. Worse with pushing and pulling, lying on right shoulder. Pain is sharp. Not taking any medicines or icing this. Tried compression sleeve. Right handed. No skin changes, fevers, bruising, swelling.  Past Medical History  Diagnosis Date  . GERD (gastroesophageal reflux disease)   . Anxiety   . Hyperlipidemia     Current Outpatient Prescriptions on File Prior to Visit  Medication Sig Dispense Refill  . albuterol (PROAIR HFA) 108 (90 BASE) MCG/ACT inhaler Inhale 2 puffs into the lungs every 6 (six) hours as needed for wheezing. 1 Inhaler 0  . FLUoxetine (PROZAC) 20 MG tablet Take 60 mg by mouth daily.    . mometasone (NASONEX) 50 MCG/ACT nasal spray Place 2 sprays into the nose daily. 17 g 2   No current facility-administered medications on file prior to visit.    Past Surgical History  Procedure Laterality Date  . 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 Marital Status: Married    Spouse Name: N/A  . Number of Children: 2  . Years of Education: N/A   Occupational History  . Art gallery manager    Social History Main Topics  . Smoking status: Never Smoker   . Smokeless tobacco: Not on file  . Alcohol Use: 0.0 oz/week    0 Standard drinks or equivalent per week  . Drug Use: Not on file  . Sexual Activity: Not on file   Other Topics Concern  . Not on file   Social History Narrative   Regular exercise-  intermittent    Family History  Problem Relation Age of Onset  . Hypertension    . Hypertension Father   . Hyperlipidemia Father   . Lupus Sister   . Heart attack Neg Hx   . Diabetes Neg Hx   . Sudden death Neg Hx     BP 136/85 mmHg  Pulse 80  Ht 5\' 10"  (1.778 m)  Wt 212 lb (96.163 kg)  BMI 30.42 kg/m2  Review of Systems: See HPI above.    Objective:  Physical Exam:  Gen: NAD  Right shoulder: No swelling, ecchymoses.  No gross deformity. No TTP. FROM with pain on ER and IR. Negative Hawkins, Neers. Negative Speeds, Yergasons. Strength 5/5 with empty can and resisted internal/external rotation.  Pain ER and IR. Negative apprehension. NV intact distally.  Left shoulder: FROM without pain or weakness.    Assessment & Plan:  1. Right shoulder pain - 2/2 strains of infraspinatus and subscapularis.  NSAIDs, tylenol if needed.  Icing.  Shown home exercises to do daily.  Relative rest discussed.  Consider ultrasound, injection, PT if not improving as expected.  F/u in 1 month.

## 2014-11-23 ENCOUNTER — Ambulatory Visit: Payer: Managed Care, Other (non HMO) | Admitting: Family Medicine

## 2015-02-11 ENCOUNTER — Encounter: Payer: Self-pay | Admitting: Family Medicine

## 2015-02-11 ENCOUNTER — Ambulatory Visit (INDEPENDENT_AMBULATORY_CARE_PROVIDER_SITE_OTHER): Payer: Managed Care, Other (non HMO) | Admitting: Family Medicine

## 2015-02-11 VITALS — BP 119/81 | HR 73 | Ht 71.0 in | Wt 215.0 lb

## 2015-02-11 DIAGNOSIS — M25511 Pain in right shoulder: Secondary | ICD-10-CM

## 2015-02-11 MED ORDER — PREDNISONE 10 MG PO TABS: ORAL_TABLET | ORAL | Status: DC

## 2015-02-11 MED ORDER — NITROGLYCERIN 0.2 MG/HR TD PT24: MEDICATED_PATCH | TRANSDERMAL | Status: DC

## 2015-02-11 NOTE — Patient Instructions (Signed)
You have a partial thickness insertional supraspinatus (rotator cuff) tear. Use nitro patches - 1/4th patch to affected area, change daily. Prednisone dose pack - take as directed. Start physical therapy and do home exercises on days you don't go to therapy. Follow up with me in 6 weeks for reevaluation.

## 2015-02-15 NOTE — Assessment & Plan Note (Signed)
2/2 partial thickness supraspinatus tear seen on ultrasound and infraspinatus strain.  Start nitro patches, prednisone dose pack.  Advised against doing an injection.  Start physical therapy and home exercises.  F/u in 6 weeks.

## 2015-02-15 NOTE — Progress Notes (Signed)
PCP: Garnet Koyanagi, DO  Subjective:   HPI: Carl Lane is a 40 y.o. male here for right shoulder pain.  10/23/14: Carl Lane denies an acute injury or trauma. Recalls about a month ago he had some soreness in right bicep area that then improved. Some pain with pronation, supination when he had that. Was in a martial arts tournament this past weekend and, again, no injury but following this shoulder pain has worsened. Pain level [id number] with certain motions. Worse with pushing and pulling, lying on right shoulder. Pain is sharp. Not taking any medicines or icing this. Tried compression sleeve. Right handed. No skin changes, fevers, bruising, swelling.  02/11/15: Carl Lane reports he continues to have pain though is down to 0000000 with certain movements, 0000000 at rest. Worse sleeping on right side. Pain still sharp, lateral No skin changes, fever, other complaints.  Past Medical History  Diagnosis Date  . GERD (gastroesophageal reflux disease)   . Anxiety   . Hyperlipidemia     Current Outpatient Prescriptions on File Prior to Visit  Medication Sig Dispense Refill  . albuterol (PROAIR HFA) 108 (90 BASE) MCG/ACT inhaler Inhale 2 puffs into the lungs every 6 (six) hours as needed for wheezing. 1 Inhaler 0  . FLUoxetine (PROZAC) 20 MG tablet Take 60 mg by mouth daily.    . mometasone (NASONEX) 50 MCG/ACT nasal spray Place 2 sprays into the nose daily. 17 g 2   No current facility-administered medications on file prior to visit.    Past Surgical History  Procedure Laterality Date  . 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 Marital Status: Married    Spouse Name: N/A  . Number of Children: 2  . Years of Education: N/A   Occupational History  . Art gallery manager    Social History Main Topics  . Smoking status: Never Smoker   . Smokeless tobacco: Not on file  .  Alcohol Use: 0.0 oz/week    0 Standard drinks or equivalent per week  . Drug Use: Not on file  . Sexual Activity: Not on file   Other Topics Concern  . Not on file   Social History Narrative   Regular exercise- intermittent    Family History  Problem Relation Age of Onset  . Hypertension    . Hypertension Father   . Hyperlipidemia Father   . Lupus Sister   . Heart attack Neg Hx   . Diabetes Neg Hx   . Sudden death Neg Hx     BP 119/81 mmHg  Pulse 73  Ht 5\' 11"  (1.803 m)  Wt 215 lb (97.523 kg)  BMI 30.00 kg/m2  Review of Systems: See HPI above.    Objective:  Physical Exam:  Gen: NAD  Right shoulder: No swelling, ecchymoses.  No gross deformity. No TTP. FROM with painful arc > ER now. Negative Hawkins, Neers. Negative Speeds, Yergasons. Strength 5/5 with empty can and resisted internal/external rotation.  Pain empty can > ER Negative apprehension. NV intact distally.  Left shoulder: FROM without pain or weakness.    MSK u/s right shoulder: biceps tendon intact on trans and long views.  AC joint normal.  Subscapularis and infraspinatus intact without tears on long and trans views.  Supraspinatus appears to have hypoechoic area at insertion consistent with partial thickness tear.  Images saved for documentation.  Assessment &  Plan:  1. Right shoulder pain - 2/2 partial thickness supraspinatus tear seen on ultrasound and infraspinatus strain.  Start nitro patches, prednisone dose pack.  Advised against doing an injection.  Start physical therapy and home exercises.  F/u in 6 weeks.

## 2015-03-25 ENCOUNTER — Ambulatory Visit: Payer: Managed Care, Other (non HMO) | Admitting: Family Medicine

## 2015-04-13 ENCOUNTER — Encounter: Payer: Self-pay | Admitting: Family Medicine

## 2015-04-13 ENCOUNTER — Telehealth: Payer: Self-pay | Admitting: Family Medicine

## 2015-04-13 ENCOUNTER — Ambulatory Visit (INDEPENDENT_AMBULATORY_CARE_PROVIDER_SITE_OTHER): Payer: Managed Care, Other (non HMO) | Admitting: Family Medicine

## 2015-04-13 VITALS — BP 122/82 | HR 76 | Ht 70.0 in | Wt 215.0 lb

## 2015-04-13 DIAGNOSIS — M25511 Pain in right shoulder: Secondary | ICD-10-CM

## 2015-04-13 NOTE — Patient Instructions (Signed)
Call me if you want to go ahead with an MRI. Otherwise continue home exercises 3 sets of 10 once a day. Nitro patches still if you can tolerate these for another 6 weeks. Physical therapy is an option too but you have a high deductible. Follow up with me as needed otherwise.

## 2015-04-14 NOTE — Assessment & Plan Note (Signed)
2/2 partial thickness supraspinatus tear seen on ultrasound and infraspinatus strain.  Still with very slow or stalled progress with home exercises, using nitro patches.  S/p prednisone dose pack.  We discussed physical therapy, MRI.  He will consider these and call if he would like to pursue them.

## 2015-04-14 NOTE — Telephone Encounter (Signed)
Spoke to patient and advised him to contact his insurance company.

## 2015-04-14 NOTE — Progress Notes (Signed)
PCP: Ann Held, DO  Subjective:   HPI: Patient is a 40 y.o. male here for right shoulder pain.  10/23/14: Patient denies an acute injury or trauma. Recalls about a month ago he had some soreness in right bicep area that then improved. Some pain with pronation, supination when he had that. Was in a martial arts tournament this past weekend and, again, no injury but following this shoulder pain has worsened. Pain level [id number] with certain motions. Worse with pushing and pulling, lying on right shoulder. Pain is sharp. Not taking any medicines or icing this. Tried compression sleeve. Right handed. No skin changes, fevers, bruising, swelling.  02/11/15: Patient reports he continues to have pain though is down to 0000000 with certain movements, 0000000 at rest. Worse sleeping on right side. Pain still sharp, lateral No skin changes, fever, other complaints.  4/5: Patient reports pain is up to 3/10 especially in the morning, sharp lateral shoulder. Doing home exercises. Physical therapy was too expensive. Nitro gives him headaches so only using every other day. Pain with pushing, pulling, lifting. No skin changes, fever.  Past Medical History  Diagnosis Date  . GERD (gastroesophageal reflux disease)   . Anxiety   . Hyperlipidemia     Current Outpatient Prescriptions on File Prior to Visit  Medication Sig Dispense Refill  . albuterol (PROAIR HFA) 108 (90 BASE) MCG/ACT inhaler Inhale 2 puffs into the lungs every 6 (six) hours as needed for wheezing. 1 Inhaler 0  . ALPRAZolam (XANAX) 0.25 MG tablet     . FLUoxetine (PROZAC) 20 MG tablet Take 60 mg by mouth daily.    . mometasone (NASONEX) 50 MCG/ACT nasal spray Place 2 sprays into the nose daily. 17 g 2  . nitroGLYCERIN (NITRODUR - DOSED IN MG/24 HR) 0.2 mg/hr patch Apply 1/4th patch to affected shoulder, change daily 30 patch 1  . predniSONE (DELTASONE) 10 MG tablet 6 tabs po day 1, 5 tabs po day 2, 4 tabs po day 3, 3 tabs po  day 4, 2 tabs po day 5, 1 tab po day 6 21 tablet 0   No current facility-administered medications on file prior to visit.    Past Surgical History  Procedure Laterality Date  . 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 Marital Status: Married    Spouse Name: N/A  . Number of Children: 2  . Years of Education: N/A   Occupational History  . Art gallery manager    Social History Main Topics  . Smoking status: Never Smoker   . Smokeless tobacco: Not on file  . Alcohol Use: 0.0 oz/week    0 Standard drinks or equivalent per week  . Drug Use: Not on file  . Sexual Activity: Not on file   Other Topics Concern  . Not on file   Social History Narrative   Regular exercise- intermittent    Family History  Problem Relation Age of Onset  . Hypertension    . Hypertension Father   . Hyperlipidemia Father   . Lupus Sister   . Heart attack Neg Hx   . Diabetes Neg Hx   . Sudden death Neg Hx     BP 122/82 mmHg  Pulse 76  Ht 5\' 10"  (1.778 m)  Wt 215 lb (97.523 kg)  BMI 30.85 kg/m2  Review of Systems:  See HPI above.    Objective:  Physical Exam:  Gen: NAD  Right shoulder: No swelling, ecchymoses.  No gross deformity. No TTP. FROM with painful arc > ER. Negative Hawkins, Neers. Negative Speeds, Yergasons. Strength 5/5 with empty can and resisted internal/external rotation.  Pain empty can > ER Negative apprehension. NV intact distally.  Left shoulder: FROM without pain or weakness.  Assessment & Plan:  1. Right shoulder pain - 2/2 partial thickness supraspinatus tear seen on ultrasound and infraspinatus strain.  Still with very slow or stalled progress with home exercises, using nitro patches.  S/p prednisone dose pack.  We discussed physical therapy, MRI.  He will consider these and call if he would like to pursue them.

## 2015-06-15 ENCOUNTER — Telehealth: Payer: Self-pay | Admitting: Family Medicine

## 2015-06-15 NOTE — Telephone Encounter (Signed)
Attempted to contact patient to reschedule appointment with Dr. Carollee Herter for 06/21/2015. Left message for patient to call office to reschedule. Appointment canceled.

## 2015-06-21 ENCOUNTER — Encounter: Payer: Managed Care, Other (non HMO) | Admitting: Family Medicine

## 2015-10-22 ENCOUNTER — Encounter: Payer: Managed Care, Other (non HMO) | Admitting: Family Medicine

## 2016-02-03 ENCOUNTER — Ambulatory Visit (INDEPENDENT_AMBULATORY_CARE_PROVIDER_SITE_OTHER): Payer: Managed Care, Other (non HMO) | Admitting: Family Medicine

## 2016-02-03 ENCOUNTER — Encounter: Payer: Self-pay | Admitting: Family Medicine

## 2016-02-03 VITALS — BP 138/88 | HR 92 | Temp 97.5°F | Resp 16 | Ht 70.3 in | Wt 222.0 lb

## 2016-02-03 DIAGNOSIS — Z23 Encounter for immunization: Secondary | ICD-10-CM | POA: Diagnosis not present

## 2016-02-03 DIAGNOSIS — Z0000 Encounter for general adult medical examination without abnormal findings: Secondary | ICD-10-CM | POA: Diagnosis not present

## 2016-02-03 NOTE — Progress Notes (Signed)
Pre visit review using our clinic review tool, if applicable. No additional management support is needed unless otherwise documented below in the visit note. 

## 2016-02-03 NOTE — Assessment & Plan Note (Signed)
ghm utd Check labs See AVS 

## 2016-02-03 NOTE — Progress Notes (Signed)
Patient ID: Carl Lane, male    DOB: [birth year]  Age: 41 y.o. MRN: [id number]   I acted as a Education administrator for Dr. Carollee Herter.  [name], CMA  Subjective:  Subjective  HPI Carl Lane presents for annual exam.   Review of Systems  Constitutional: Negative for appetite change, diaphoresis, fatigue and unexpected weight change.  Eyes: Negative for pain, redness and visual disturbance.  Respiratory: Negative for cough, chest tightness, shortness of breath and wheezing.   Cardiovascular: Negative for chest pain, palpitations and leg swelling.  Endocrine: Negative for cold intolerance, heat intolerance, polydipsia, polyphagia and polyuria.  Genitourinary: Negative for difficulty urinating, dysuria and frequency.  Neurological: Negative for dizziness, light-headedness, numbness and headaches.    History Past Medical History:  Diagnosis Date  . Anxiety   . GERD (gastroesophageal reflux disease)   . Hyperlipidemia     He has a past surgical history that includes Ophthalmologic cyst.   His family history includes Hyperlipidemia in his father; Hypertension in his father; Lupus in his sister.He reports that he has never smoked. He has never used smokeless tobacco. He reports that he drinks alcohol. He reports that he does not use drugs.  Current Outpatient Prescriptions on File Prior to Visit  Medication Sig Dispense Refill  . ALPRAZolam (XANAX) 0.25 MG tablet 0.25 mg every 6 (six) hours.      No current facility-administered medications on file prior to visit.      Objective:  Objective  Physical Exam  Constitutional: He is oriented to person, place, and time. He appears well-developed and well-nourished. No distress.  HENT:  Head: Normocephalic and atraumatic.  Right Ear: External ear normal.  Left Ear: External ear normal.  Nose: Nose normal.  Mouth/Throat: Oropharynx is clear and moist. No oropharyngeal exudate.  Eyes: Conjunctivae and EOM are normal. Pupils are equal,  round, and reactive to light. Right eye exhibits no discharge. Left eye exhibits no discharge.  Neck: Normal range of motion. Neck supple. No JVD present. No thyromegaly present.  Cardiovascular: Normal rate, regular rhythm and intact distal pulses.   No murmur heard. Pulmonary/Chest: Effort normal and breath sounds normal. No respiratory distress. He has no wheezes. He has no rales. He exhibits no tenderness.  Abdominal: Soft. Bowel sounds are normal. He exhibits no distension and no mass. There is no tenderness. There is no rebound and no guarding.  Genitourinary: Prostate normal and penis normal.  Musculoskeletal: Normal range of motion. He exhibits no edema or tenderness.  Lymphadenopathy:    He has no cervical adenopathy.  Neurological: He is alert and oriented to person, place, and time. He displays normal reflexes. No cranial nerve deficit. He exhibits normal muscle tone.  Skin: Skin is warm and dry. No rash noted. He is not diaphoretic. No erythema.  Psychiatric: He has a normal mood and affect. His behavior is normal. Judgment and thought content normal.  Nursing note and vitals reviewed.  BP 138/88 (BP Location: Right Arm, Cuff Size: Normal)   Pulse 92   Temp 97.5 F (36.4 C) (Oral)   Resp 16   Ht 5' 10.3" (1.786 m)   Wt 222 lb (100.7 kg)   SpO2 99%   BMI 31.58 kg/m  Wt Readings from Last 3 Encounters:  02/03/16 222 lb (100.7 kg)  04/13/15 215 lb (97.5 kg)  02/11/15 215 lb (97.5 kg)     Lab Results  Component Value Date   WBC 9.5 05/24/2010   HGB 15.0 05/24/2010   HCT 43.3  05/24/2010   PLT 215.0 05/24/2010   GLUCOSE 99 04/09/2009   CHOL 164 03/18/2008   TRIG 182 (H) 03/18/2008   HDL 26.9 (L) 03/18/2008   LDLDIRECT 103.5 10/04/2007   LDLCALC 101 (H) 03/18/2008   ALT 51 04/09/2009   AST 35 04/09/2009   NA 146 (H) 04/09/2009   K 3.7 04/09/2009   CL 106 04/09/2009   CREATININE 1.1 04/09/2009   BUN 15 04/09/2009   CO2 27 04/09/2009   TSH 1.74 03/18/2008     Dg Foot Complete Left  Result Date: 07/16/2012 *RADIOLOGY REPORT* Clinical Data: Injury with left fourth and fifth toe pain LEFT FOOT - COMPLETE 3+ VIEW Comparison: None. Findings: No fracture.  The joints are normally spaced and aligned. The soft tissues are unremarkable.  There are minor plantar and dorsal calcaneal spurs. IMPRESSION: No fracture or dislocation. Original Report Authenticated By: Lajean Manes, M.D.     Assessment & Plan:  Plan  I have discontinued Mr. Roehl mometasone, albuterol, predniSONE, and nitroGLYCERIN. I am also having him maintain his ALPRAZolam and FLUoxetine.  Meds ordered this encounter  Medications  . FLUoxetine (PROZAC) 40 MG capsule    Sig: 80 mg daily.    Problem List Items Addressed This Visit      Unprioritized   Preventative health care - Primary    ghm utd Check labs See AVS      Relevant Orders   Lipid panel   PSA   Comprehensive metabolic panel   CBC with Differential/Platelet   TSH   POCT Urinalysis Dipstick (Automated)    Other Visit Diagnoses    Need for diphtheria-tetanus-pertussis (Tdap) vaccine       Relevant Orders   Tdap vaccine greater than or equal to 7yo IM (Completed)      Follow-up: Return in about 1 year (around 02/02/2017) for annual exam, fasting.  Ann Held, DO

## 2016-02-03 NOTE — Patient Instructions (Signed)
Preventive Care 41-64 Years, Male Preventive care refers to lifestyle choices and visits with your health care provider that can promote health and wellness. What does preventive care include?  A yearly physical exam. This is also called an annual well check.  Dental exams once or twice a year.  Routine eye exams. Ask your health care provider how often you should have your eyes checked.  Personal lifestyle choices, including:  Daily care of your teeth and gums.  Regular physical activity.  Eating a healthy diet.  Avoiding tobacco and drug use.  Limiting alcohol use.  Practicing safe sex.  Taking low-dose aspirin every day starting at age 50. What happens during an annual well check? The services and screenings done by your health care provider during your annual well check will depend on your age, overall health, lifestyle risk factors, and family history of disease. Counseling  Your health care provider may ask you questions about your:  Alcohol use.  Tobacco use.  Drug use.  Emotional well-being.  Home and relationship well-being.  Sexual activity.  Eating habits.  Work and work environment. Screening  You may have the following tests or measurements:  Height, weight, and BMI.  Blood pressure.  Lipid and cholesterol levels. These may be checked every 5 years, or more frequently if you are over 50 years old.  Skin check.  Lung cancer screening. You may have this screening every year starting at age 55 if you have a 30-pack-year history of smoking and currently smoke or have quit within the past 15 years.  Fecal occult blood test (FOBT) of the stool. You may have this test every year starting at age 50.  Flexible sigmoidoscopy or colonoscopy. You may have a sigmoidoscopy every 5 years or a colonoscopy every 10 years starting at age 50.  Prostate cancer screening. Recommendations will vary depending on your family history and other risks.  Hepatitis C  blood test.  Hepatitis B blood test.  Sexually transmitted disease (STD) testing.  Diabetes screening. This is done by checking your blood sugar (glucose) after you have not eaten for a while (fasting). You may have this done every 1-3 years. Discuss your test results, treatment options, and if necessary, the need for more tests with your health care provider. Vaccines  Your health care provider may recommend certain vaccines, such as:  Influenza vaccine. This is recommended every year.  Tetanus, diphtheria, and acellular pertussis (Tdap, Td) vaccine. You may need a Td booster every 10 years.  Varicella vaccine. You may need this if you have not been vaccinated.  Zoster vaccine. You may need this after age 60.  Measles, mumps, and rubella (MMR) vaccine. You may need at least one dose of MMR if you were born in 1957 or later. You may also need a second dose.  Pneumococcal 13-valent conjugate (PCV13) vaccine. You may need this if you have certain conditions and have not been vaccinated.  Pneumococcal polysaccharide (PPSV23) vaccine. You may need one or two doses if you smoke cigarettes or if you have certain conditions.  Meningococcal vaccine. You may need this if you have certain conditions.  Hepatitis A vaccine. You may need this if you have certain conditions or if you travel or work in places where you may be exposed to hepatitis A.  Hepatitis B vaccine. You may need this if you have certain conditions or if you travel or work in places where you may be exposed to hepatitis B.  Haemophilus influenzae type b (Hib)   vaccine. You may need this if you have certain risk factors. Talk to your health care provider about which screenings and vaccines you need and how often you need them. This information is not intended to replace advice given to you by your health care provider. Make sure you discuss any questions you have with your health care provider. Document Released: 01/22/2015  Document Revised: 09/15/2015 Document Reviewed: 10/27/2014 Elsevier Interactive Patient Education  2017 [employer].

## 2016-04-03 DIAGNOSIS — R42 Dizziness and giddiness: Secondary | ICD-10-CM

## 2016-04-03 HISTORY — DX: Dizziness and giddiness: R42

## 2016-05-31 ENCOUNTER — Encounter: Payer: Self-pay | Admitting: Medical

## 2016-05-31 ENCOUNTER — Ambulatory Visit (INDEPENDENT_AMBULATORY_CARE_PROVIDER_SITE_OTHER): Payer: Managed Care, Other (non HMO) | Admitting: Medical

## 2016-05-31 VITALS — BP 128/71 | HR 84 | Temp 98.4°F | Resp 14 | Ht 70.0 in | Wt 214.2 lb

## 2016-05-31 DIAGNOSIS — R519 Headache, unspecified: Secondary | ICD-10-CM

## 2016-05-31 DIAGNOSIS — R51 Headache: Secondary | ICD-10-CM

## 2016-05-31 MED ORDER — SUMATRIPTAN SUCCINATE 50 MG PO TABS: 50.0000 mg | ORAL_TABLET | ORAL | 0 refills | Status: DC | PRN

## 2016-05-31 NOTE — Progress Notes (Signed)
Subjective:    Patient ID: Carl Lane, male    DOB: 05/23/1975, 41 y.o.   MRN: [id number]  HPI  Pt in for recent ha. Ha started on Saturday. Pt has hx of migraine about once ha a month for past 2 years. This has been pattern for a few years(excedrein or alleve will help or stop in past). Pt usually get nausea and mild sound senstiive with ha. He gets ha usually on rt side of his head. This weekend state ha more like a band around head. No gross motor or sensory function deficits. Pt never had migraine ha medication. Pt had mri about 10 years when he had vertigo but was negative. Pt was taking excedrin this weekend. Pt states Saturday mild ha at onset. Sunday and Monday moderate ha. Yesterday in am pain started to taper off. Now pain states level 1/10 pain. More like tension/stress worrying ha may come back.  Pt has anxiety and panic disorder. So he speculates maybe tension component of ha.  Review of Systems  Constitutional: Negative for chills, fatigue and fever.  HENT: Negative for congestion, sinus pain and sinus pressure.   Respiratory: Negative for cough, chest tightness, shortness of breath and wheezing.   Cardiovascular: Negative for chest pain and palpitations.  Gastrointestinal: Negative for abdominal pain.  Musculoskeletal: Negative for back pain.  Skin: Negative for rash.  Neurological: Positive for headaches. Negative for dizziness, syncope, speech difficulty, weakness, light-headedness and numbness.       Virtually gone now. See hpi  Hematological: Negative for adenopathy. Does not bruise/bleed easily.  Psychiatric/Behavioral: Negative for behavioral problems, confusion, self-injury, sleep disturbance and suicidal ideas. The patient is nervous/anxious.        Hx of anxiety and speculates maybe tension ha if not migraine.    Past Medical History:  Diagnosis Date  . Anxiety   . GERD (gastroesophageal reflux disease)   . Hyperlipidemia      Social History   Social  History  . Marital status: Married    Spouse name: N/A  . Number of children: 2  . Years of education: N/A   Occupational History  . Art gallery manager    Social History Main Topics  . Smoking status: Never Smoker  . Smokeless tobacco: Never Used  . Alcohol use 0.0 oz/week     Comment: rarely  . Drug use: No  . Sexual activity: Not on file   Other Topics Concern  . Not on file   Social History Narrative   Regular exercise- intermittent    Past Surgical History:  Procedure Laterality Date  . 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 Hypertension Unknown   . Hypertension Father   . Hyperlipidemia Father   . Lupus Sister   . Heart attack Neg Hx   . Diabetes Neg Hx   . Sudden death Neg Hx     No Known Allergies  Current Outpatient Prescriptions on File Prior to Visit  Medication Sig Dispense Refill  . ALPRAZolam (XANAX) 0.25 MG tablet 0.25 mg every 6 (six) hours.     Marland Kitchen FLUoxetine (PROZAC) 40 MG capsule 80 mg daily.     No current facility-administered medications on file prior to visit.     BP 128/71 (BP Location: Left Arm, Patient Position: Sitting, Cuff Size: Normal)   Pulse 84   Temp 98.4  F (36.9 C) (Oral)   Resp 14   Ht 5\' 10"  (1.778 m)   Wt 214 lb 3.2 oz (97.2 kg)   SpO2 100%   BMI 30.73 kg/m       Objective:   Physical Exam  General Mental Status- Alert. General Appearance- Not in acute distress.   Skin General: Color- Normal Color. Moisture- Normal Moisture.  Neck Carotid Arteries- Normal color. Moisture- Normal Moisture. No carotid bruits. No JVD. No trapezius tenderness.  Chest and Lung Exam Auscultation: Breath Sounds:-Normal.  Cardiovascular Auscultation:Rythm- Regular. Murmurs & Other Heart Sounds:Auscultation of the heart reveals- No Murmurs.  Abdomen Inspection:-Inspeection Normal. Palpation/Percussion:Note:No mass.  Palpation and Percussion of the abdomen reveal- Non Tender, Non Distended + BS, no rebound or guarding.    Neurologic Cranial Nerve exam:- CN III-XII intact(No nystagmus), symmetric smile. Drift Test:- No drift. Romberg Exam:- Negative.  Finger to Nose:- Normal/Intact Strength:- 5/5 equal and symmetric strength both upper and lower extremities.      Assessment & Plan:  For ha which seems more migraine like will make make imitrex available. You have virtually no ha now(did advise today could use low dose ibuprofen). But use imitrex next time severe ha onset occurs. Then repeat in 2 hours if needed. If after 2 tab no improvement we could see you for tramadol injection. In event of ha with any worrisome signs or symptoms as discussed then ED evaluation.  Considering you may have some tension ha component but want to see how you respond to imtrex first.   Please follow up June 4th or 5th. We need to see how you respond to treatmment. In some cases if ha features change or don't respond we get imaging studies or refer to neurologist.  Mackie Pai, PA-C

## 2016-05-31 NOTE — Patient Instructions (Addendum)
For ha which seems more migraine like will make make imitrex available. You have virtually no ha now(did advise today could use low dose ibuprofen). But use imitrex next time severe ha onset occurs. Then repeat in 2 hours if needed. If after 2 tab no improvement we could see you for tramadol injection. In event of ha with any worrisome signs or symptoms as discussed then ED evaluation.  Considering you may have some tension ha component but want to see how you respond to imtrex first.   Please follow up June 4th or 5th. We need to see how you respond to treatmment. In some cases if ha features change or don't respond we get imaging studies or refer to neurologist.

## 2016-06-11 ENCOUNTER — Telehealth: Payer: Self-pay | Admitting: Medical

## 2016-06-11 NOTE — Telephone Encounter (Signed)
Please finish you incomplete note which has your name attached to it. I can't close out note since epic states you note is still open. Will you address this tomorrow or Tuesday. Will you get Caryl Pina or someone to help you close. I have been trying to close this for 2 weeks but can't. So please help me by closing portion of your note?

## 2016-06-12 NOTE — Telephone Encounter (Signed)
Note is closed should be ale to sign.

## 2016-06-13 ENCOUNTER — Ambulatory Visit: Payer: Managed Care, Other (non HMO) | Admitting: Medical

## 2017-05-22 ENCOUNTER — Ambulatory Visit (INDEPENDENT_AMBULATORY_CARE_PROVIDER_SITE_OTHER): Payer: Managed Care, Other (non HMO) | Admitting: Family Medicine

## 2017-05-22 ENCOUNTER — Encounter: Payer: Self-pay | Admitting: Family Medicine

## 2017-05-22 VITALS — BP 126/80 | HR 81 | Temp 98.1°F | Resp 16 | Ht 70.0 in | Wt 221.6 lb

## 2017-05-22 DIAGNOSIS — Z0000 Encounter for general adult medical examination without abnormal findings: Secondary | ICD-10-CM | POA: Diagnosis not present

## 2017-05-22 DIAGNOSIS — E785 Hyperlipidemia, unspecified: Secondary | ICD-10-CM | POA: Diagnosis not present

## 2017-05-22 DIAGNOSIS — M79602 Pain in left arm: Secondary | ICD-10-CM

## 2017-05-22 HISTORY — DX: Pain in left arm: M79.602

## 2017-05-22 HISTORY — DX: Hyperlipidemia, unspecified: E78.5

## 2017-05-22 NOTE — Assessment & Plan Note (Addendum)
Check labs today Will start medication if needed  Encouraged heart healthy diet, increase exercise, avoid trans fats, consider a krill oil cap daily

## 2017-05-22 NOTE — Assessment & Plan Note (Signed)
Shoulder and arm pain Probably musculoskeletal Pt does not want to do anything right now  It is probably from sleeping on his left side with his arm reaching up

## 2017-05-22 NOTE — Progress Notes (Addendum)
Patient ID: Carl Lane, male   DOB: 1975-02-22, 42 y.o.   MRN: [id number]     Subjective:  I acted as a Education administrator for Dr. Carollee Herter.  Carl Lane, Carl Lane   Patient ID: Carl Lane, male    DOB: [birth year], 42 y.o.   MRN: [id number]  Chief Complaint  Patient presents with  . left shoulder pain  . blood work    HPI  Patient is in today for left shoulder pain and wants blood work.  He does martial arts and has had torn lig etc before but this feels different.    Patient Care Team: Carollee Herter, Alferd Apa, DO as PCP - General Chucky May, MD as Consulting Physician (Psychiatry)   Past Medical History:  Diagnosis Date  . Anxiety   . GERD (gastroesophageal reflux disease)   . Hyperlipidemia     Past Surgical History:  Procedure Laterality Date  . 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 Hypertension Unknown   . Hypertension Father   . Hyperlipidemia Father   . Lupus Sister   . Other Sister        bicuspid aortic disease and had it replaced at 69  . Atrial fibrillation Mother        treated with medication  . Heart attack Maternal Grandfather        died at 71  . Other Maternal Aunt        bicuspbid aortic disease and had it replaced at 74  . Heart attack Cousin        maternal cousin  . Heart disease Maternal Uncle        quadrupal bypass  . Diabetes Neg Hx   . Sudden death Neg Hx     Social History   Socioeconomic History  . Marital status: Married    Spouse name: Not on file  . Number of children: 2  . Years of education: Not on file  . Highest education level: Not on file  Occupational History  . Occupation: Art gallery manager  Social Needs  . Financial resource strain: Not on file  . Food insecurity:    Worry: Not on file    Inability: Not on file  . Transportation needs:    Medical: Not on file    Non-medical: Not on file  Tobacco Use    . Smoking status: Never Smoker  . Smokeless tobacco: Never Used  Substance and Sexual Activity  . Alcohol use: Yes    Alcohol/week: 0.0 standard drinks    Comment: rarely  . Drug use: No  . Sexual activity: Not on file  Lifestyle  . Physical activity:    Days per week: Not on file    Minutes per session: Not on file  . Stress: Not on file  Relationships  . Social connections:    Talks on phone: Not on file    Gets together: Not on file    Attends religious service: Not on file    Active member of club or organization: Not on file    Attends meetings of clubs or organizations: Not on file    Relationship status: Not on file  . Intimate partner violence:    Fear of current or ex partner: Not on file    Emotionally abused: Not on file    Physically abused: Not on file  Forced sexual activity: Not on file  Other Topics Concern  . Not on file  Social History Narrative   Regular exercise- intermittent    Outpatient Medications Prior to Visit  Medication Sig Dispense Refill  . FLUoxetine (PROZAC) 40 MG capsule 80 mg daily.    . SUMAtriptan (IMITREX) 50 MG tablet Take 1 tablet (50 mg total) by mouth every 2 (two) hours as needed for migraine. May repeat in 2 hours if headache persists or recurs. 10 tablet 0  . ALPRAZolam (XANAX) 0.25 MG tablet 0.25 mg every 6 (six) hours.      No facility-administered medications prior to visit.     No Known Allergies  Review of Systems  Constitutional: Negative for fever and malaise/fatigue.  HENT: Negative for congestion.   Eyes: Negative for blurred vision.  Respiratory: Negative for cough and shortness of breath.   Cardiovascular: Negative for chest pain, palpitations and leg swelling.  Gastrointestinal: Negative for vomiting.  Musculoskeletal: Positive for joint pain (left shoulder). Negative for back pain.  Skin: Negative for rash.  Neurological: Negative for loss of consciousness and headaches.       Objective:    Physical  Exam  Constitutional: He is oriented to person, place, and time. Vital signs are normal. He appears well-developed and well-nourished. He is sleeping.  HENT:  Head: Normocephalic and atraumatic.  Mouth/Throat: Oropharynx is clear and moist.  Eyes: Pupils are equal, round, and reactive to light. EOM are normal.  Neck: Normal range of motion. Neck supple. No thyromegaly present.  Cardiovascular: Normal rate and regular rhythm.  No murmur heard. Pulmonary/Chest: Effort normal and breath sounds normal. No respiratory distress. He has no wheezes. He has no rales. He exhibits no tenderness.  Musculoskeletal: Normal range of motion. He exhibits no edema or tenderness.  Pt has full rom but some tenderness with rom activities  Neurological: He is alert and oriented to person, place, and time.  Skin: Skin is warm and dry.  Psychiatric: He has a normal mood and affect. His behavior is normal. Judgment and thought content normal.  Nursing note and vitals reviewed.   BP 126/80 (BP Location: Right Arm, Cuff Size: Normal)   Pulse 81   Temp 98.1 F (36.7 C) (Oral)   Resp 16   Ht 5\' 10"  (1.778 m)   Wt 221 lb 9.6 oz (100.5 kg)   SpO2 99%   BMI 31.80 kg/m  Wt Readings from Last 3 Encounters:  12/10/17 230 lb 9.6 oz (104.6 kg)  05/22/17 221 lb 9.6 oz (100.5 kg)  05/31/16 214 lb 3.2 oz (97.2 kg)   BP Readings from Last 3 Encounters:  12/10/17 122/65  05/22/17 126/80  05/31/16 128/71     Immunization History  Administered Date(s) Administered  . Influenza Split 12/12/2010  . Influenza Whole 11/25/2009  . Td 08/21/2006  . Tdap 02/03/2016    Health Maintenance  Topic Date Due  . INFLUENZA VACCINE  08/09/2017  . HIV Screening  12/11/2023 (Originally 09/18/1990)  . TETANUS/TDAP  02/02/2026    Lab Results  Component Value Date   WBC 9.5 05/24/2010   HGB 15.0 05/24/2010   HCT 43.3 05/24/2010   PLT 215.0 05/24/2010   GLUCOSE 86 05/22/2017   CHOL 217 (H) 05/22/2017   TRIG 182.0 (H)  05/22/2017   HDL 33.90 (L) 05/22/2017   LDLDIRECT 103.5 10/04/2007   LDLCALC 147 (H) 05/22/2017   ALT 24 05/22/2017   AST 20 05/22/2017   NA 138 05/22/2017   K  4.3 05/22/2017   CL 101 05/22/2017   CREATININE 0.98 05/22/2017   BUN 14 05/22/2017   CO2 26 05/22/2017   TSH 1.74 03/18/2008    Lab Results  Component Value Date   TSH 1.74 03/18/2008   Lab Results  Component Value Date   WBC 9.5 05/24/2010   HGB 15.0 05/24/2010   HCT 43.3 05/24/2010   MCV 84.2 05/24/2010   PLT 215.0 05/24/2010   Lab Results  Component Value Date   NA 138 05/22/2017   K 4.3 05/22/2017   CO2 26 05/22/2017   GLUCOSE 86 05/22/2017   BUN 14 05/22/2017   CREATININE 0.98 05/22/2017   BILITOT 2.1 (H) 05/22/2017   ALKPHOS 47 05/22/2017   AST 20 05/22/2017   ALT 24 05/22/2017   PROT 7.1 05/22/2017   ALBUMIN 4.3 05/22/2017   CALCIUM 9.7 05/22/2017   GFR 89.29 05/22/2017   Lab Results  Component Value Date   CHOL 217 (H) 05/22/2017   Lab Results  Component Value Date   HDL 33.90 (L) 05/22/2017   Lab Results  Component Value Date   LDLCALC 147 (H) 05/22/2017   Lab Results  Component Value Date   TRIG 182.0 (H) 05/22/2017   Lab Results  Component Value Date   CHOLHDL 6 05/22/2017   No results found for: HGBA1C     ekg-- NSR  Assessment & Plan:   Problem List Items Addressed This Visit      Unprioritized   Hyperlipidemia LDL goal <100 - Primary    Check labs today Will start medication if needed  Encouraged heart healthy diet, increase exercise, avoid trans fats, consider a krill oil cap daily      Relevant Orders   Lipid panel (Completed)   Comprehensive metabolic panel (Completed)   Left arm pain    Shoulder and arm pain Probably musculoskeletal Pt does not want to do anything right now  It is probably from sleeping on his left side with his arm reaching up      Relevant Orders   EKG 12-Lead (Completed)    Other Visit Diagnoses    Preventative health care            I am having Shanon Brow B. [place] maintain his FLUoxetine and SUMAtriptan.  No orders of the defined types were placed in this encounter.   CMA served as Education administrator during this visit. History, Physical and Plan performed by medical provider. Documentation and orders reviewed and attested to.  Ann Held, DO

## 2017-05-22 NOTE — Patient Instructions (Signed)
Shoulder Pain Many things can cause shoulder pain, including:  An injury to the area.  Overuse of the shoulder.  Arthritis.  The source of the pain can be:  Inflammation.  An injury to the shoulder joint.  An injury to a tendon, ligament, or bone.  Follow these instructions at home: Take these actions to help with your pain:  Squeeze a soft ball or a foam pad as much as possible. This helps to keep the shoulder from swelling. It also helps to strengthen the arm.  Take over-the-counter and prescription medicines only as told by your health care provider.  If directed, apply ice to the area: ? Put ice in a plastic bag. ? Place a towel between your skin and the bag. ? Leave the ice on for 20 minutes, 2-3 times per day. Stop applying ice if it does not help with the pain.  If you were given a shoulder sling or immobilizer: ? Wear it as told. ? Remove it to shower or bathe. ? Move your arm as little as possible, but keep your hand moving to prevent swelling.  Contact a health care provider if:  Your pain gets worse.  Your pain is not relieved with medicines.  New pain develops in your arm, hand, or fingers. Get help right away if:  Your arm, hand, or fingers: ? Tingle. ? Become numb. ? Become swollen. ? Become painful. ? Turn white or blue. This information is not intended to replace advice given to you by your health care provider. Make sure you discuss any questions you have with your health care provider. Document Released: 10/05/2004 Document Revised: 08/22/2015 Document Reviewed: 04/20/2014 Elsevier Interactive Patient Education  2018 [employer]  

## 2017-05-23 LAB — COMPREHENSIVE METABOLIC PANEL
ALBUMIN: 4.3 g/dL (ref 3.5–5.2)
ALT: 24 U/L (ref 0–53)
AST: 20 U/L (ref 0–37)
Alkaline Phosphatase: 47 U/L (ref 39–117)
BUN: 14 mg/dL (ref 6–23)
CALCIUM: 9.7 mg/dL (ref 8.4–10.5)
CHLORIDE: 101 meq/L (ref 96–112)
CO2: 26 mEq/L (ref 19–32)
Creatinine, Ser: 0.98 mg/dL (ref 0.40–1.50)
GFR: 89.29 mL/min (ref >60.00)
Glucose, Bld: 86 mg/dL (ref 70–99)
POTASSIUM: 4.3 meq/L (ref 3.5–5.1)
Sodium: 138 mEq/L (ref 135–145)
Total Bilirubin: 2.1 mg/dL — ABNORMAL HIGH (ref 0.2–1.2)
Total Protein: 7.1 g/dL (ref 6.0–8.3)

## 2017-05-23 LAB — LIPID PANEL
CHOL/HDL RATIO: 6
Cholesterol: 217 mg/dL — ABNORMAL HIGH (ref 0–200)
HDL: 33.9 mg/dL — AB (ref >39.00)
LDL Cholesterol: 147 mg/dL — ABNORMAL HIGH (ref 0–99)
NONHDL: 183.13
TRIGLYCERIDES: 182 mg/dL — AB (ref 0.0–149.0)
VLDL: 36.4 mg/dL (ref 0.0–40.0)

## 2017-12-10 ENCOUNTER — Ambulatory Visit (INDEPENDENT_AMBULATORY_CARE_PROVIDER_SITE_OTHER): Payer: Managed Care, Other (non HMO) | Admitting: Family Medicine

## 2017-12-10 ENCOUNTER — Encounter: Payer: Self-pay | Admitting: Family Medicine

## 2017-12-10 VITALS — BP 122/65 | HR 69 | Temp 98.2°F | Resp 16 | Ht 70.0 in | Wt 230.6 lb

## 2017-12-10 DIAGNOSIS — Z0000 Encounter for general adult medical examination without abnormal findings: Secondary | ICD-10-CM | POA: Insufficient documentation

## 2017-12-10 DIAGNOSIS — Z8249 Family history of ischemic heart disease and other diseases of the circulatory system: Secondary | ICD-10-CM | POA: Diagnosis not present

## 2017-12-10 HISTORY — DX: Encounter for general adult medical examination without abnormal findings: Z00.00

## 2017-12-10 LAB — CBC WITH DIFFERENTIAL/PLATELET
Basophils Absolute: 0 10*3/uL (ref 0.0–0.1)
Basophils Relative: 0.7 % (ref 0.0–3.0)
EOS PCT: 3.6 % (ref 0.0–5.0)
Eosinophils Absolute: 0.2 10*3/uL (ref 0.0–0.7)
HEMATOCRIT: 42.9 % (ref 39.0–52.0)
Hemoglobin: 14.5 g/dL (ref 13.0–17.0)
LYMPHS ABS: 2.4 10*3/uL (ref 0.7–4.0)
LYMPHS PCT: 35.1 % (ref 12.0–46.0)
MCHC: 33.9 g/dL (ref 30.0–36.0)
MCV: 83.5 fl (ref 78.0–100.0)
MONOS PCT: 7.2 % (ref 3.0–12.0)
Monocytes Absolute: 0.5 10*3/uL (ref 0.1–1.0)
NEUTROS ABS: 3.7 10*3/uL (ref 1.4–7.7)
NEUTROS PCT: 53.4 % (ref 43.0–77.0)
Platelets: 268 10*3/uL (ref 150.0–400.0)
RBC: 5.13 Mil/uL (ref 4.22–5.81)
RDW: 13 % (ref 11.5–15.5)
WBC: 6.9 10*3/uL (ref 4.0–10.5)

## 2017-12-10 LAB — LIPID PANEL
Cholesterol: 202 mg/dL — ABNORMAL HIGH (ref 0–200)
HDL: 30.5 mg/dL — AB (ref >39.00)
NONHDL: 171.28
Total CHOL/HDL Ratio: 7
Triglycerides: 305 mg/dL — ABNORMAL HIGH (ref 0.0–149.0)
VLDL: 61 mg/dL — AB (ref 0.0–40.0)

## 2017-12-10 LAB — COMPREHENSIVE METABOLIC PANEL
ALT: 34 U/L (ref 0–53)
AST: 23 U/L (ref 0–37)
Albumin: 4.1 g/dL (ref 3.5–5.2)
Alkaline Phosphatase: 55 U/L (ref 39–117)
BUN: 13 mg/dL (ref 6–23)
CHLORIDE: 104 meq/L (ref 96–112)
CO2: 30 mEq/L (ref 19–32)
Calcium: 9.7 mg/dL (ref 8.4–10.5)
Creatinine, Ser: 0.98 mg/dL (ref 0.40–1.50)
GFR: 89.05 mL/min (ref >60.00)
GLUCOSE: 86 mg/dL (ref 70–99)
POTASSIUM: 4.2 meq/L (ref 3.5–5.1)
SODIUM: 140 meq/L (ref 135–145)
Total Bilirubin: 0.8 mg/dL (ref 0.2–1.2)
Total Protein: 6.7 g/dL (ref 6.0–8.3)

## 2017-12-10 LAB — TSH: TSH: 2.41 u[IU]/mL (ref 0.35–4.50)

## 2017-12-10 LAB — LDL CHOLESTEROL, DIRECT: Direct LDL: 116 mg/dL

## 2017-12-10 NOTE — Patient Instructions (Signed)

## 2017-12-10 NOTE — Assessment & Plan Note (Signed)
ghm utd Check labs See AVS 

## 2017-12-10 NOTE — Progress Notes (Signed)
Patient ID: Carl Lane, male    DOB: [birth year]  Age: 42 y.o. MRN: [id number]    Subjective:  Subjective  HPI TREVONTAE LINDAHL presents for cpe   Pt just found out he has had a lot of family member with heart attacks, valve abnormalities , a fib.  He is requesting to see cardiology for calcium score.    Review of Systems  Constitutional: Negative for chills, fatigue, fever and unexpected weight change.  HENT: Negative for congestion and hearing loss.   Eyes: Negative for discharge.  Respiratory: Negative for cough and shortness of breath.   Cardiovascular: Negative for chest pain, palpitations and leg swelling.  Gastrointestinal: Negative for abdominal pain, blood in stool, constipation, diarrhea, nausea and vomiting.  Genitourinary: Negative for dysuria, frequency, hematuria and urgency.  Musculoskeletal: Negative for back pain and myalgias.  Skin: Negative for rash.  Allergic/Immunologic: Negative for environmental allergies.  Neurological: Negative for dizziness, weakness and headaches.  Hematological: Does not bruise/bleed easily.  Psychiatric/Behavioral: Negative for suicidal ideas. The patient is not nervous/anxious.     History Past Medical History:  Diagnosis Date  . Anxiety   . GERD (gastroesophageal reflux disease)   . Hyperlipidemia     He has a past surgical history that includes Ophthalmologic cyst.   His family history includes Atrial fibrillation in his mother; Heart attack in his cousin and maternal grandfather; Heart disease in his maternal uncle; Hyperlipidemia in his father; Hypertension in his father and unknown relative; Lupus in his sister; Other in his maternal aunt and sister.He reports that he has never smoked. He has never used smokeless tobacco. He reports that he drinks alcohol. He reports that he does not use drugs.  Current Outpatient Medications on File Prior to Visit  Medication Sig Dispense Refill  . ALPRAZolam (XANAX) 0.5 MG tablet Take 1  tablet by mouth 3 (three) times daily.    Marland Kitchen FLUoxetine (PROZAC) 40 MG capsule 80 mg daily.    . SUMAtriptan (IMITREX) 50 MG tablet Take 1 tablet (50 mg total) by mouth every 2 (two) hours as needed for migraine. May repeat in 2 hours if headache persists or recurs. 10 tablet 0   No current facility-administered medications on file prior to visit.      Objective:  Objective  Physical Exam  Constitutional: He is oriented to person, place, and time. He appears well-developed and well-nourished. No distress.  HENT:  Head: Normocephalic and atraumatic.  Right Ear: External ear normal.  Left Ear: External ear normal.  Nose: Nose normal.  Mouth/Throat: Oropharynx is clear and moist. No oropharyngeal exudate.  Eyes: Pupils are equal, round, and reactive to light. Conjunctivae and EOM are normal. Right eye exhibits no discharge. Left eye exhibits no discharge.  Neck: Normal range of motion. Neck supple. No JVD present. No thyromegaly present.  Cardiovascular: Normal rate, regular rhythm, normal heart sounds and intact distal pulses.  No murmur heard. Pulmonary/Chest: Effort normal and breath sounds normal. No respiratory distress. He has no wheezes. He has no rales. He exhibits no tenderness.  Abdominal: Soft. Bowel sounds are normal. He exhibits no distension and no mass. There is no tenderness. There is no rebound and no guarding. Hernia confirmed negative in the right inguinal area and confirmed negative in the left inguinal area.  Genitourinary: Prostate normal, testes normal and penis normal.  Genitourinary Comments: Rectal -- heme neg brown stool   Musculoskeletal: Normal range of motion. He exhibits no edema or tenderness.  Lymphadenopathy:  He has no cervical adenopathy.  Neurological: He is alert and oriented to person, place, and time. He has normal reflexes. He displays normal reflexes. No cranial nerve deficit. He exhibits normal muscle tone.  Skin: Skin is warm and dry. No rash  noted. He is not diaphoretic. No erythema.  Psychiatric: He has a normal mood and affect. His behavior is normal. Judgment and thought content normal.  Nursing note and vitals reviewed.  BP 122/65 (BP Location: Right Arm, Cuff Size: Large)   Pulse 69   Temp 98.2 F (36.8 C) (Oral)   Resp 16   Ht 5\' 10"  (1.778 m)   Wt 230 lb 9.6 oz (104.6 kg)   SpO2 100%   BMI 33.09 kg/m  Wt Readings from Last 3 Encounters:  12/10/17 230 lb 9.6 oz (104.6 kg)  05/22/17 221 lb 9.6 oz (100.5 kg)  05/31/16 214 lb 3.2 oz (97.2 kg)     Lab Results  Component Value Date   WBC 9.5 05/24/2010   HGB 15.0 05/24/2010   HCT 43.3 05/24/2010   PLT 215.0 05/24/2010   GLUCOSE 86 05/22/2017   CHOL 217 (H) 05/22/2017   TRIG 182.0 (H) 05/22/2017   HDL 33.90 (L) 05/22/2017   LDLDIRECT 103.5 10/04/2007   LDLCALC 147 (H) 05/22/2017   ALT 24 05/22/2017   AST 20 05/22/2017   NA 138 05/22/2017   K 4.3 05/22/2017   CL 101 05/22/2017   CREATININE 0.98 05/22/2017   BUN 14 05/22/2017   CO2 26 05/22/2017   TSH 1.74 03/18/2008    Dg Foot Complete Left  Result Date: 07/16/2012 *RADIOLOGY REPORT* Clinical Data: Injury with left fourth and fifth toe pain LEFT FOOT - COMPLETE 3+ VIEW Comparison: None. Findings: No fracture.  The joints are normally spaced and aligned. The soft tissues are unremarkable.  There are minor plantar and dorsal calcaneal spurs. IMPRESSION: No fracture or dislocation. Original Report Authenticated By: Lajean Manes, M.D.    ekg-- sinus brady-- no acute changes--- compared to ekg done 05/2017 Assessment & Plan:  Plan  I am having Shanon Brow B. [place] maintain his FLUoxetine, SUMAtriptan, and ALPRAZolam.  No orders of the defined types were placed in this encounter.   Problem List Items Addressed This Visit      Unprioritized   Preventative health care - Primary    ghm utd Check labs See AVS      Relevant Orders   Comprehensive metabolic panel   CBC with Differential/Platelet   Lipid  panel   TSH    Other Visit Diagnoses    FHx: ischemic heart disease       Relevant Orders   EKG 12-Lead (Completed)   Comprehensive metabolic panel   CBC with Differential/Platelet   Lipid panel   TSH   Ambulatory referral to Cardiology      Follow-up: Return in about 1 year (around 12/11/2018), or if symptoms worsen or fail to improve, for annual exam.  Ann Held, DO

## 2017-12-11 ENCOUNTER — Ambulatory Visit (INDEPENDENT_AMBULATORY_CARE_PROVIDER_SITE_OTHER): Payer: Managed Care, Other (non HMO) | Admitting: Cardiology

## 2017-12-11 ENCOUNTER — Encounter: Payer: Self-pay | Admitting: Cardiology

## 2017-12-11 VITALS — BP 120/76 | HR 75 | Ht 70.0 in | Wt 226.0 lb

## 2017-12-11 DIAGNOSIS — E785 Hyperlipidemia, unspecified: Secondary | ICD-10-CM | POA: Diagnosis not present

## 2017-12-11 DIAGNOSIS — R079 Chest pain, unspecified: Secondary | ICD-10-CM

## 2017-12-11 HISTORY — DX: Chest pain, unspecified: R07.9

## 2017-12-11 MED ORDER — ROSUVASTATIN CALCIUM 10 MG PO TABS: 10.0000 mg | ORAL_TABLET | Freq: Every day | ORAL | 0 refills | Status: DC

## 2017-12-11 NOTE — Patient Instructions (Addendum)
Medication Instructions:  Your physician has recommended you make the following change in your medication:   START rosuvastatin 10 mg daily  If you need a refill on your cardiac medications before your next appointment, please call your pharmacy.   Lab work: Your physician recommends that you have the following labs drawn: Please return in 6 weeks fasting (01/14) for liver and lipid panel. No appointment is needed for labs.  If you have labs (blood work) drawn today and your tests are completely normal, you will receive your results only by: Marland Kitchen MyChart Message (if you have MyChart) OR . A paper copy in the mail If you have any lab test that is abnormal or we need to change your treatment, we will call you to review the results.  Testing/Procedures: Your physician has requested that you have an echocardiogram. Echocardiography is a painless test that uses sound waves to create images of your heart. It provides your doctor with information about the size and shape of your heart and how well your heart's chambers and valves are working. This procedure takes approximately one hour. There are no restrictions for this procedure.  Your physician has requested that you have a stress echocardiogram. For further information please visit [email or web address]. Please follow instruction sheet as given.  Cardiac scoring at [place] 2nd floor; [place] [address] December 16th at 11:45, please arrive at 11:30 for the testing. $150 fee will be requested the day of service.   Follow-Up: At [facility], you and your health needs are our priority.  As part of our continuing mission to provide you with exceptional heart care, we have created designated Provider Care Teams.  These Care Teams include your primary Cardiologist (physician) and Advanced Practice Providers (APPs -  Physician Assistants and Nurse Practitioners) who all work together to provide you with the care you need, when you  need it.  You will need a follow up appointment in 6 months.  Please call our office 2 months in advance to schedule this appointment.  You may see another member of our [employer] Provider Team in [place]: Jenne Campus, MD . Shirlee More, MD  Any Other Special Instructions Will Be Listed Below (If Applicable).  Rosuvastatin Tablets What is this medicine? ROSUVASTATIN (roe SOO va sta tin) is known as a HMG-CoA reductase inhibitor or 'statin'. It lowers cholesterol and triglycerides in the blood. This drug may also reduce the risk of heart attack, stroke, or other health problems in patients with risk factors for heart disease. Diet and lifestyle changes are often used with this drug. This medicine may be used for other purposes; ask your health care provider or pharmacist if you have questions. COMMON BRAND NAME(S): Crestor What should I tell my health care provider before I take this medicine? They need to know if you have any of these conditions: -frequently drink alcoholic beverages -kidney disease -liver disease -muscle aches or weakness -other medical condition -an unusual or allergic reaction to rosuvastatin, other medicines, foods, dyes, or preservatives -pregnant or trying to get pregnant -breast-feeding How should I use this medicine? Take this medicine by mouth with a glass of water. Follow the directions on the prescription label. Do not cut, crush or chew this medicine. You can take this medicine with or without food. Take your doses at regular intervals. Do not take your medicine more often than directed. Talk to your pediatrician regarding the use of this medicine in children. While this drug may  be prescribed for children as young as 84 years old for selected conditions, precautions do apply. Overdosage: If you think you have taken too much of this medicine contact a poison control center or emergency room at once. NOTE: This medicine is only for you. Do not share  this medicine with others. What if I miss a dose? If you miss a dose, take it as soon as you can. Do not take 2 doses within 12 hours of each other. If there are less than 12 hours until your next dose, take only that dose. Do not take double or extra doses. What may interact with this medicine? Do not take this medicine with any of the following medications: -herbal medicines like red yeast rice This medicine may also interact with the following medications: -alcohol -antacids containing aluminum hydroxide or magnesium hydroxide -cyclosporine -other medicines for high cholesterol -some medicines for HIV infection -warfarin This list may not describe all possible interactions. Give your health care provider a list of all the medicines, herbs, non-prescription drugs, or dietary supplements you use. Also tell them if you smoke, drink alcohol, or use illegal drugs. Some items may interact with your medicine. What should I watch for while using this medicine? Visit your doctor or health care professional for regular check-ups. You may need regular tests to make sure your liver is working properly. Tell your doctor or health care professional right away if you get any unexplained muscle pain, tenderness, or weakness, especially if you also have a fever and tiredness. Your doctor or health care professional may tell you to stop taking this medicine if you develop muscle problems. If your muscle problems do not go away after stopping this medicine, contact your health care professional. This medicine may affect blood sugar levels. If you have diabetes, check with your doctor or health care professional before you change your diet or the dose of your diabetic medicine. Avoid taking antacids containing aluminum, calcium or magnesium within 2 hours of taking this medicine. This drug is only part of a total heart-health program. Your doctor or a dietician can suggest a low-cholesterol and low-fat diet to  help. Avoid alcohol and smoking, and keep a proper exercise schedule. Do not use this drug if you are pregnant or breast-feeding. Serious side effects to an unborn child or to an infant are possible. Talk to your doctor or pharmacist for more information. What side effects may I notice from receiving this medicine? Side effects that you should report to your doctor or health care professional as soon as possible: -allergic reactions like skin rash, itching or hives, swelling of the face, lips, or tongue -dark urine -fever -joint pain -muscle cramps, pain -redness, blistering, peeling or loosening of the skin, including inside the mouth -trouble passing urine or change in the amount of urine -unusually weak or tired -yellowing of the eyes or skin Side effects that usually do not require medical attention (report to your doctor or health care professional if they continue or are bothersome): -constipation -heartburn -nausea -stomach gas, pain, upset This list may not describe all possible side effects. Call your doctor for medical advice about side effects. You may report side effects to FDA at 1-800-FDA-1088. Where should I keep my medicine? Keep out of the reach of children. Store at room temperature between 20 and 25 degrees C (68 and 77 degrees F). Keep container tightly closed (protect from moisture). Throw away any unused medicine after the expiration date. NOTE: This sheet is a  summary. It may not cover all possible information. If you have questions about this medicine, talk to your doctor, pharmacist, or health care provider.  2018 Elsevier/Gold Standard (2014-06-11 13:33:08)

## 2017-12-11 NOTE — Progress Notes (Signed)
Cardiology Office Note:    Date:  12/11/2017   ID:  Carl Lane, DOB 03-Oct-1975, MRN [id number]  PCP:  Carollee Herter, Alferd Apa, DO  Cardiologist:  Jenean Lindau, MD   Referring MD: Carollee Herter, Alferd Apa, *    ASSESSMENT:    1. Chest pain, unspecified type   2. Hyperlipidemia LDL goal <100    PLAN:    In order of problems listed above:  1. Primary prevention stressed with the patient.  Importance of compliance with diet and medication stressed and he vocalized understanding.  Diet was discussed for dyslipidemia including issues with carbohydrate intake and he vocalized understanding.  After the stress test I told him to embark on a aerobic exercise program walking 30 minutes a day at least 5 days a week and he is agreeable. 2. His blood pressure is stable. 3. In view of his significantly elevated triglycerides and abnormal lipid profile and significant family history I discussed with him statin therapy.  His diet is fairly good at this time.  He is agreeable and I have started him on rosuvastatin 10 mg daily.  He will have a liver lipid check in 6 weeks.  Benefits and potential risks were explained and he vocalized understanding. 4. In view of family history of bicuspid aortic valve he will have a echocardiogram 5. Patient will undergo stress testing to reassure him in view of chest discomfort weight though it appears atypical from a coronary standpoint. 6. I also discussed with him about calcium score and he is agreeable and we will set it up. 7. Patient will be seen in follow-up appointment in 6 months or earlier if the patient has any concerns    Medication Adjustments/Labs and Tests Ordered: Current medicines are reviewed at length with the patient today.  Concerns regarding medicines are outlined above.  Orders Placed This Encounter  Procedures  . CT CARDIAC SCORING  . Hepatic function panel  . Lipid panel  . ECHOCARDIOGRAM COMPLETE  . ECHOCARDIOGRAM STRESS TEST    Meds ordered this encounter  Medications  . rosuvastatin (CRESTOR) 10 MG tablet    Sig: Take 1 tablet (10 mg total) by mouth daily.    Dispense:  90 tablet    Refill:  0     History of Present Illness:    Carl Lane is a 42 y.o. male who is being seen today for the evaluation of elevated risk for coronary artery disease from a family history standpoint, dyslipidemia and chest discomfort at the request of Ann Held, *.  Patient is a pleasant 42 year old male.  Has past medical history of mixed dyslipidemia with elevated triglycerides.  He mentions to me that his strong family history of coronary artery disease and bicuspid valve in her mother and her relatives.  Therefore he wants an evaluation.  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  He does not do any regular exercise from aerobic standpoint.  No chest pain orthopnea or PND.  He occasionally has chest discomfort not related to exertion.  With no radiation to the neck or to the arms.  At the time of my evaluation, the patient is alert awake oriented and in no distress.  Past Medical History:  Diagnosis Date  . Anxiety   . GERD (gastroesophageal reflux disease)   . Hyperlipidemia     Past Surgical History:  Procedure Laterality Date  . 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 ALPRAZolam (XANAX) 0.5 MG tablet Take 1 tablet by mouth 2 (two) times daily as needed.   Marland Kitchen FLUoxetine (PROZAC) 40 MG capsule Take 80 mg by mouth daily.   . SUMAtriptan (IMITREX) 50 MG tablet Take 1 tablet (50 mg total) by mouth every 2 (two) hours as needed for migraine. May repeat in 2 hours if headache persists or recurs.     Allergies:   Patient has no known allergies.   Social History   Socioeconomic History  . Marital status: Married    Spouse name: Not on file  . Number of children: 2  . Years of  education: Not on file  . Highest education level: Not on file  Occupational History  . Occupation: Art gallery manager  Social Needs  . Financial resource strain: Not on file  . Food insecurity:    Worry: Not on file    Inability: Not on file  . Transportation needs:    Medical: Not on file    Non-medical: Not on file  Tobacco Use  . Smoking status: Never Smoker  . Smokeless tobacco: Never Used  Substance and Sexual Activity  . Alcohol use: Yes    Alcohol/week: 0.0 standard drinks    Comment: rarely  . Drug use: No  . Sexual activity: Not on file  Lifestyle  . Physical activity:    Days per week: Not on file    Minutes per session: Not on file  . Stress: Not on file  Relationships  . Social connections:    Talks on phone: Not on file    Gets together: Not on file    Attends religious service: Not on file    Active member of club or organization: Not on file    Attends meetings of clubs or organizations: Not on file    Relationship status: Not on file  Other Topics Concern  . Not on file  Social History Narrative   Regular exercise- intermittent     Family History: The patient's family history includes Atrial fibrillation in his mother; Heart attack in his cousin and maternal grandfather; Heart disease in his maternal uncle; Hyperlipidemia in his father; Hypertension in his father and unknown relative; Lupus in his sister; Other in his maternal aunt and sister. There is no history of Diabetes or Sudden death.  ROS:   Please see the history of present illness.    All other systems reviewed and are negative.  EKGs/Labs/Other Studies Reviewed:    The following studies were reviewed today: EKG reveals sinus rhythm and nonspecific ST-T changes.   Recent Labs: 12/10/2017: ALT 34; BUN 13; Creatinine, Ser 0.98; Hemoglobin 14.5; Platelets 268.0; Potassium 4.2; Sodium 140; TSH 2.41  Recent Lipid Panel    Component Value Date/Time   CHOL 202 (H) 12/10/2017 0909   TRIG  305.0 (H) 12/10/2017 0909   HDL 30.50 (L) 12/10/2017 0909   CHOLHDL 7 12/10/2017 0909   VLDL 61.0 (H) 12/10/2017 0909   LDLCALC 147 (H) 05/22/2017 1536   LDLDIRECT 116.0 12/10/2017 0909    Physical Exam:    VS:  BP 120/76 (BP Location: Right Arm, Patient Position: Sitting, Cuff Size: Normal)   Pulse 75   Ht 5\' 10"  (1.778 m)   Wt 226 lb (102.5 kg)   SpO2 98%   BMI 32.43 kg/m     Wt Readings from Last 3 Encounters:  12/11/17  226 lb (102.5 kg)  12/10/17 230 lb 9.6 oz (104.6 kg)  05/22/17 221 lb 9.6 oz (100.5 kg)     GEN: Patient is in no acute distress HEENT: Normal NECK: No JVD; No carotid bruits LYMPHATICS: No lymphadenopathy CARDIAC: S1 S2 regular, 2/6 systolic murmur at the apex. RESPIRATORY:  Clear to auscultation without rales, wheezing or rhonchi  ABDOMEN: Soft, non-tender, non-distended MUSCULOSKELETAL:  No edema; No deformity  SKIN: Warm and dry NEUROLOGIC:  Alert and oriented x 3 PSYCHIATRIC:  Normal affect    Signed, Jenean Lindau, MD  12/11/2017 4:53 PM    [facility]

## 2017-12-17 ENCOUNTER — Ambulatory Visit (HOSPITAL_BASED_OUTPATIENT_CLINIC_OR_DEPARTMENT_OTHER)
Admission: RE | Admit: 2017-12-17 | Discharge: 2017-12-17 | Disposition: A | Discharge status: Home / Self Care | Payer: Managed Care, Other (non HMO) | Source: Ambulatory Visit | Attending: Cardiology | Admitting: Cardiology

## 2017-12-17 DIAGNOSIS — R079 Chest pain, unspecified: Secondary | ICD-10-CM | POA: Insufficient documentation

## 2017-12-17 NOTE — Progress Notes (Signed)
  Echocardiogram 2D Echocardiogram has been performed.  Carl Lane T Elliot Meldrum 12/17/2017, 2:40 PM

## 2017-12-20 ENCOUNTER — Ambulatory Visit (HOSPITAL_BASED_OUTPATIENT_CLINIC_OR_DEPARTMENT_OTHER): Payer: Managed Care, Other (non HMO)

## 2017-12-24 ENCOUNTER — Ambulatory Visit
Admission: RE | Admit: 2017-12-24 | Discharge: 2017-12-24 | Disposition: A | Discharge status: Home / Self Care | Payer: Managed Care, Other (non HMO) | Source: Ambulatory Visit | Attending: Cardiology | Admitting: Cardiology

## 2017-12-24 DIAGNOSIS — R079 Chest pain, unspecified: Secondary | ICD-10-CM

## 2017-12-26 ENCOUNTER — Encounter: Payer: Self-pay | Admitting: Family Medicine

## 2017-12-26 ENCOUNTER — Ambulatory Visit (INDEPENDENT_AMBULATORY_CARE_PROVIDER_SITE_OTHER): Payer: Managed Care, Other (non HMO) | Admitting: Family Medicine

## 2017-12-26 VITALS — BP 110/72 | HR 60 | Temp 97.8°F | Ht 70.0 in | Wt 219.0 lb

## 2017-12-26 DIAGNOSIS — J069 Acute upper respiratory infection, unspecified: Secondary | ICD-10-CM

## 2017-12-26 DIAGNOSIS — B9789 Other viral agents as the cause of diseases classified elsewhere: Secondary | ICD-10-CM

## 2017-12-26 NOTE — Patient Instructions (Signed)
Consider using Flonase.  Continue to push fluids, practice good hand hygiene, and cover your mouth if you cough.  If you start having fevers, shaking or shortness of breath, seek immediate care.  For symptoms, consider using Vick's VapoRub on chest or under nose, air humidifier, Benadryl at night, and elevating the head of the bed. Tylenol and ibuprofen for aches and pains you may be experiencing.   Let us know if you need anything.

## 2017-12-26 NOTE — Progress Notes (Signed)
Chief Complaint  Patient presents with  . Cough  . Wheezing    Carl Lane here for URI complaints.  Duration: 4 days  Associated symptoms: sinus congestion, rhinorrhea, ear fullness, sore throat, wheezing and cough Denies: sinus pain, itchy watery eyes, ear pain, ear drainage, shortness of breath and fevers Treatment to date: Tylenol, ibuprofen Sick contacts: Yes  ROS:  Const: Denies fevers HEENT: As noted in HPI Lungs: No SOB  Past Medical History:  Diagnosis Date  . Anxiety   . GERD (gastroesophageal reflux disease)   . Hyperlipidemia     BP 110/72 (BP Location: Left Arm, Patient Position: Sitting, Cuff Size: Normal)   Pulse 60   Temp 97.8 F (36.6 C) (Oral)   Ht 5\' 10"  (1.778 m)   Wt 219 lb (99.3 kg)   SpO2 97%   BMI 31.42 kg/m  General: Awake, alert, appears stated age HEENT: AT, [place], ears patent b/l and TM's neg w exception of 70% cerumen blockage on L, nares patent w/o discharge, pharynx pink and without exudates, MMM Neck: No masses or asymmetry Heart: RRR Lungs: CTAB, no accessory muscle use Psych: Age appropriate judgment and insight, normal mood and affect  Viral URI with cough  No abx, offered depo injection, declined at this time. INCS rec'd, he has Flonase at home that he will use if ear bothers him.  Continue to push fluids, practice good hand hygiene, cover mouth when coughing. F/u prn. If starting to experience fevers, shaking, or shortness of breath, seek immediate care. Pt voiced understanding and agreement to the plan.  [place], DO 12/26/17 1:46 PM

## 2017-12-26 NOTE — Progress Notes (Signed)
Pre visit review using our clinic review tool, if applicable. No additional management support is needed unless otherwise documented below in the visit note. 

## 2017-12-27 DIAGNOSIS — I159 Secondary hypertension, unspecified: Secondary | ICD-10-CM

## 2017-12-27 DIAGNOSIS — E785 Hyperlipidemia, unspecified: Secondary | ICD-10-CM

## 2018-04-26 ENCOUNTER — Telehealth: Payer: Self-pay

## 2018-04-26 NOTE — Telephone Encounter (Signed)
Left message for patient to call back to schedule for 6 mo f/u (recall)

## 2018-09-13 ENCOUNTER — Other Ambulatory Visit: Payer: Self-pay | Admitting: *Deleted

## 2018-09-13 MED ORDER — ROSUVASTATIN CALCIUM 10 MG PO TABS: 10.0000 mg | ORAL_TABLET | Freq: Every day | ORAL | 0 refills | Status: DC

## 2019-01-22 ENCOUNTER — Other Ambulatory Visit: Payer: Self-pay

## 2019-01-23 ENCOUNTER — Ambulatory Visit (INDEPENDENT_AMBULATORY_CARE_PROVIDER_SITE_OTHER): Payer: Managed Care, Other (non HMO) | Admitting: Family Medicine

## 2019-01-23 ENCOUNTER — Ambulatory Visit (HOSPITAL_BASED_OUTPATIENT_CLINIC_OR_DEPARTMENT_OTHER)
Admission: RE | Admit: 2019-01-23 | Discharge: 2019-01-23 | Disposition: A | Discharge status: Home / Self Care | Payer: Managed Care, Other (non HMO) | Source: Ambulatory Visit | Attending: Family Medicine | Admitting: Family Medicine

## 2019-01-23 ENCOUNTER — Other Ambulatory Visit: Payer: Self-pay

## 2019-01-23 ENCOUNTER — Encounter: Payer: Self-pay | Admitting: Family Medicine

## 2019-01-23 VITALS — BP 122/78 | HR 95 | Temp 97.8°F | Resp 18 | Ht 70.0 in | Wt 227.4 lb

## 2019-01-23 DIAGNOSIS — M545 Low back pain, unspecified: Secondary | ICD-10-CM

## 2019-01-23 DIAGNOSIS — M546 Pain in thoracic spine: Secondary | ICD-10-CM

## 2019-01-23 DIAGNOSIS — E785 Hyperlipidemia, unspecified: Secondary | ICD-10-CM

## 2019-01-23 MED ORDER — MELOXICAM 15 MG PO TABS: 15.0000 mg | ORAL_TABLET | Freq: Every day | ORAL | 0 refills | Status: DC

## 2019-01-23 NOTE — Progress Notes (Signed)
Patient ID: Carl Lane, male    DOB: 1975-08-11  Age: 44 y.o. MRN: [id number]    Subjective:  Subjective  HPI Carl Lane presents for low back pain , stiffness after working out or when he does not work out enough  He also c/o joing pains and is concerned about ankylosing spondylitis  No other complaints   Review of Systems  Constitutional: Negative.   HENT: Negative for congestion, ear pain, hearing loss, nosebleeds, postnasal drip, rhinorrhea, sinus pressure, sneezing and tinnitus.   Eyes: Negative for photophobia, discharge, itching and visual disturbance.  Respiratory: Negative.   Cardiovascular: Negative.   Gastrointestinal: Negative for abdominal distention, abdominal pain, anal bleeding, blood in stool and constipation.  Endocrine: Negative.   Genitourinary: Negative.   Musculoskeletal: Negative.   Skin: Negative.   Allergic/Immunologic: Negative.   Neurological: Negative for dizziness, weakness, light-headedness, numbness and headaches.  Psychiatric/Behavioral: Negative for agitation, confusion, decreased concentration, dysphoric mood, sleep disturbance and suicidal ideas. The patient is not nervous/anxious.     History Past Medical History:  Diagnosis Date  . Anxiety   . GERD (gastroesophageal reflux disease)   . Hyperlipidemia     He has a past surgical history that includes Ophthalmologic cyst.   His family history includes Atrial fibrillation in his mother; Heart attack in his cousin and maternal grandfather; Heart disease in his maternal uncle; Hyperlipidemia in his father; Hypertension in his father and unknown relative; Lupus in his sister; Other in his maternal aunt and sister.He reports that he has never smoked. He has never used smokeless tobacco. He reports current alcohol use. He reports that he does not use drugs.  Current Outpatient Medications on File Prior to Visit  Medication Sig Dispense Refill  . ALPRAZolam (XANAX) 0.5 MG tablet Take 1  tablet by mouth 2 (two) times daily as needed.     Marland Kitchen FLUoxetine (PROZAC) 40 MG capsule Take 80 mg by mouth daily.     . SUMAtriptan (IMITREX) 50 MG tablet Take 1 tablet (50 mg total) by mouth every 2 (two) hours as needed for migraine. May repeat in 2 hours if headache persists or recurs. 10 tablet 0  . rosuvastatin (CRESTOR) 10 MG tablet Take 1 tablet (10 mg total) by mouth daily. 90 tablet 0   No current facility-administered medications on file prior to visit.     Objective:  Objective  Physical Exam Vitals and nursing note reviewed.  Constitutional:      General: He is sleeping.     Appearance: He is well-developed.  HENT:     Head: Normocephalic and atraumatic.  Eyes:     Pupils: Pupils are equal, round, and reactive to light.  Neck:     Thyroid: No thyromegaly.  Cardiovascular:     Rate and Rhythm: Normal rate and regular rhythm.     Heart sounds: No murmur.  Pulmonary:     Effort: Pulmonary effort is normal. No respiratory distress.     Breath sounds: Normal breath sounds. No wheezing or rales.  Chest:     Chest wall: No tenderness.  Musculoskeletal:        General: No tenderness or deformity. Normal range of motion.     Cervical back: Normal range of motion and neck supple.  Skin:    General: Skin is warm and dry.  Neurological:     Mental Status: He is oriented to person, place, and time.  Psychiatric:        Behavior: Behavior normal.  Thought Content: Thought content normal.        Judgment: Judgment normal.    BP 122/78 (BP Location: Right Arm, Patient Position: Sitting, Cuff Size: Normal)   Pulse 95   Temp 97.8 F (36.6 C) (Temporal)   Resp 18   Ht '5\' 10"'  (1.778 m)   Wt 227 lb 6.4 oz (103.1 kg)   SpO2 97%   BMI 32.63 kg/m  Wt Readings from Last 3 Encounters:  01/23/19 227 lb 6.4 oz (103.1 kg)  12/26/17 219 lb (99.3 kg)  12/11/17 226 lb (102.5 kg)     Lab Results  Component Value Date   WBC 7.9 01/23/2019   HGB 15.5 01/23/2019   HCT  45.2 01/23/2019   PLT 270.0 01/23/2019   GLUCOSE 116 (H) 01/23/2019   CHOL 201 (H) 01/23/2019   TRIG 389.0 (H) 01/23/2019   HDL 30.60 (L) 01/23/2019   LDLDIRECT 114.0 01/23/2019   LDLCALC 147 (H) 05/22/2017   ALT 24 01/23/2019   AST 20 01/23/2019   NA 139 01/23/2019   K 3.8 01/23/2019   CL 103 01/23/2019   CREATININE 1.05 01/23/2019   BUN 14 01/23/2019   CO2 27 01/23/2019   TSH 2.41 12/10/2017   MICROALBUR 0.9 01/23/2019    CT CARDIAC SCORING  Addendum Date: 12/24/2017   ADDENDUM REPORT: 12/24/2017 14:14 CLINICAL DATA:  Risk stratification EXAM: Coronary Calcium Score TECHNIQUE: The patient was scanned on a Siemens Somatom 64 slice scanner. Axial non-contrast 3 mm slices were carried out through the heart. The data set was analyzed on a dedicated work station and scored using the [facility]. FINDINGS: Non-cardiac: See separate report from [facility]. Ascending aorta: Normal diameter 3.0 cm Pericardium: Normal Coronary arteries: No calcium noted IMPRESSION: Coronary calcium score of 0. Carl Lane Electronically Signed   By: Carl Lane M.D.   On: 12/24/2017 14:14   Result Date: 12/24/2017 EXAM: OVER-READ INTERPRETATION  CT CHEST The following report is an over-read performed by radiologist Dr. Vinnie Lane of [facility], [place] on 12/24/2017. This over-read does not include interpretation of cardiac or coronary anatomy or pathology. The coronary calcium score interpretation by the cardiologist is attached. COMPARISON:  None. FINDINGS: Within the visualized portions of the thorax there are no suspicious appearing pulmonary nodules or masses, there is no acute consolidative airspace disease, no pleural effusions, no pneumothorax and no lymphadenopathy. Visualized portions of the upper abdomen are unremarkable. There are no aggressive appearing lytic or blastic lesions noted in the visualized portions of the skeleton. IMPRESSION: 1. No significant incidental  noncardiac findings are noted. Electronically Signed: By: Carl Lane M.D. On: 12/24/2017 12:01     Assessment & Plan:  Plan  I am having Carl Lane start on meloxicam. I am also having him maintain his FLUoxetine, SUMAtriptan, ALPRAZolam, and rosuvastatin.  Meds ordered this encounter  Medications  . meloxicam (MOBIC) 15 MG tablet    Sig: Take 1 tablet (15 mg total) by mouth daily.    Dispense:  30 tablet    Refill:  0    Problem List Items Addressed This Visit      Unprioritized   Backache - Primary   Relevant Medications   meloxicam (MOBIC) 15 MG tablet   Other Relevant Orders   CBC with Differential (Completed)   Microalbumin / creatinine urine ratio (Completed)   Lipid panel (Completed)   Comprehensive metabolic panel (Completed)   Rheumatoid Factor (Completed)   HLA-B27 Antigen (Completed)   DG Lumbar Spine  Complete (Completed)   DG Thoracic Spine 2 View (Completed)   Ambulatory referral to Chiropractic   POCT Urinalysis Dipstick (Automated)   CBC with Differential (Completed)   Microalbumin / creatinine urine ratio (Completed)   Lipid panel (Completed)   Comprehensive metabolic panel (Completed)   Rheumatoid Factor (Completed)   HLA-B27 Antigen (Completed)   DG Lumbar Spine Complete (Completed)   DG Thoracic Spine 2 View (Completed)   Ambulatory referral to Chiropractic   POCT Urinalysis Dipstick (Automated)      Follow-up: No follow-ups on file.  Ann Held, DO

## 2019-01-23 NOTE — Patient Instructions (Addendum)
COVID-19 Vaccine Information can be found at: ShippingScam.co.uk For questions related to vaccine distribution or appointments, please email [email or web address] or call [phone].      Chronic Back Pain When back pain lasts longer than 3 months, it is called chronic back pain.The cause of your back pain may not be known. Some common causes include:  Wear and tear (degenerative disease) of the bones, ligaments, or disks in your back.  Inflammation and stiffness in your back (arthritis). People who have chronic back pain often go through certain periods in which the pain is more intense (flare-ups). Many people can learn to manage the pain with home care. Follow these instructions at home: Pay attention to any changes in your symptoms. Take these actions to help with your pain: Activity   Avoid bending and other activities that make the problem worse.  Maintain a proper position when standing or sitting: ? When standing, keep your upper back and neck straight, with your shoulders pulled back. Avoid slouching. ? When sitting, keep your back straight and relax your shoulders. Do not round your shoulders or pull them backward.  Do not sit or stand in one place for long periods of time.  Take brief periods of rest throughout the day. This will reduce your pain. Resting in a lying or standing position is usually better than sitting to rest.  When you are resting for longer periods, mix in some mild activity or stretching between periods of rest. This will help to prevent stiffness and pain.  Get regular exercise. Ask your health care provider what activities are safe for you.  Do not lift anything that is heavier than 10 lb (4.5 kg). Always use proper lifting technique, which includes: ? Bending your knees. ? Keeping the load close to your body. ? Avoiding twisting. ? Sleep on a firm mattress in a comfortable position.  Try lying on your side with your knees slightly bent. If you lie on Put ice in a plastic bag.  Pyour back, put a pillow under your knees. Managing pain  If directed, apply ice to the painful area. Your health care provider may recommend applying ice during the first 24-48 hours after a flare-up begins. ? lace a towel between your skin and the bag. ? Leave the ice on for 20 minutes, 2-3 times per day.  If directed, apply heat to the affected area as often as told by your health care provider. Use the heat source that your health care provider recommends, such as a moist heat pack or a heating pad. ? Place a towel between your skin and the heat source. ? Leave the heat on for 20-30 minutes. ? Remove the heat if your skin turns bright red. This is especially important if you are unable to feel pain, heat, or cold. You may have a greater risk of getting burned.  Try soaking in a warm tub.  Take over-the-counter and prescription medicines only as told by your health care provider.  Keep all follow-up visits as told by your health care provider. This is important. Contact a health care provider if:  You have pain that is not relieved with rest or medicine. Get help right away if:  You have weakness or numbness in one or both of your legs or feet.  You have trouble controlling your bladder or your bowels.  You have nausea or vomiting.  You have pain in your abdomen.  You have shortness of breath or you faint. This information is not intended  to replace advice given to you by your health care provider. Make sure you discuss any questions you have with your health care provider. Document Revised: 04/18/2018 Document Reviewed: 07/05/2016 Elsevier Patient Education  [place].

## 2019-01-24 LAB — LIPID PANEL
Cholesterol: 201 mg/dL — ABNORMAL HIGH (ref 0–200)
HDL: 30.6 mg/dL — ABNORMAL LOW (ref >39.00)
NonHDL: 170.23
Total CHOL/HDL Ratio: 7
Triglycerides: 389 mg/dL — ABNORMAL HIGH (ref 0.0–149.0)
VLDL: 77.8 mg/dL — ABNORMAL HIGH (ref 0.0–40.0)

## 2019-01-24 LAB — MICROALBUMIN / CREATININE URINE RATIO
Creatinine,U: 335.3 mg/dL
Microalb Creat Ratio: 0.3 mg/g (ref 0.0–30.0)
Microalb, Ur: 0.9 mg/dL (ref 0.0–1.9)

## 2019-01-24 LAB — COMPREHENSIVE METABOLIC PANEL
ALT: 24 U/L (ref 0–53)
AST: 20 U/L (ref 0–37)
Albumin: 4.4 g/dL (ref 3.5–5.2)
Alkaline Phosphatase: 59 U/L (ref 39–117)
BUN: 14 mg/dL (ref 6–23)
CO2: 27 mEq/L (ref 19–32)
Calcium: 9.6 mg/dL (ref 8.4–10.5)
Chloride: 103 mEq/L (ref 96–112)
Creatinine, Ser: 1.05 mg/dL (ref 0.40–1.50)
GFR: 76.96 mL/min (ref >60.00)
Glucose, Bld: 116 mg/dL — ABNORMAL HIGH (ref 70–99)
Potassium: 3.8 mEq/L (ref 3.5–5.1)
Sodium: 139 mEq/L (ref 135–145)
Total Bilirubin: 1.5 mg/dL — ABNORMAL HIGH (ref 0.2–1.2)
Total Protein: 7 g/dL (ref 6.0–8.3)

## 2019-01-24 LAB — CBC WITH DIFFERENTIAL/PLATELET
Basophils Absolute: 0.1 10*3/uL (ref 0.0–0.1)
Basophils Relative: 1.4 % (ref 0.0–3.0)
Eosinophils Absolute: 0.2 10*3/uL (ref 0.0–0.7)
Eosinophils Relative: 2.2 % (ref 0.0–5.0)
HCT: 45.2 % (ref 39.0–52.0)
Hemoglobin: 15.5 g/dL (ref 13.0–17.0)
Lymphocytes Relative: 26.1 % (ref 12.0–46.0)
Lymphs Abs: 2 10*3/uL (ref 0.7–4.0)
MCHC: 34.2 g/dL (ref 30.0–36.0)
MCV: 84.1 fl (ref 78.0–100.0)
Monocytes Absolute: 0.5 10*3/uL (ref 0.1–1.0)
Monocytes Relative: 6.6 % (ref 3.0–12.0)
Neutro Abs: 5 10*3/uL (ref 1.4–7.7)
Neutrophils Relative %: 63.7 % (ref 43.0–77.0)
Platelets: 270 10*3/uL (ref 150.0–400.0)
RBC: 5.37 Mil/uL (ref 4.22–5.81)
RDW: 12.9 % (ref 11.5–15.5)
WBC: 7.9 10*3/uL (ref 4.0–10.5)

## 2019-01-24 LAB — RHEUMATOID FACTOR: Rheumatoid fact SerPl-aCnc: <14 IU/mL (ref <14)

## 2019-01-24 LAB — HLA-B27 ANTIGEN: HLA-B27 Antigen: NEGATIVE

## 2019-01-24 LAB — LDL CHOLESTEROL, DIRECT: Direct LDL: 114 mg/dL

## 2019-01-27 ENCOUNTER — Other Ambulatory Visit: Payer: Self-pay | Admitting: Family Medicine

## 2019-01-27 DIAGNOSIS — R739 Hyperglycemia, unspecified: Secondary | ICD-10-CM

## 2019-01-27 DIAGNOSIS — E785 Hyperlipidemia, unspecified: Secondary | ICD-10-CM

## 2019-01-28 ENCOUNTER — Other Ambulatory Visit: Payer: Self-pay

## 2019-01-28 MED ORDER — ROSUVASTATIN CALCIUM 10 MG PO TABS: 10.0000 mg | ORAL_TABLET | Freq: Every day | ORAL | 0 refills | Status: DC

## 2019-10-14 ENCOUNTER — Other Ambulatory Visit: Payer: Self-pay | Admitting: Cardiology

## 2019-12-09 DIAGNOSIS — F419 Anxiety disorder, unspecified: Secondary | ICD-10-CM | POA: Insufficient documentation

## 2019-12-09 DIAGNOSIS — E785 Hyperlipidemia, unspecified: Secondary | ICD-10-CM | POA: Insufficient documentation

## 2019-12-09 DIAGNOSIS — K219 Gastro-esophageal reflux disease without esophagitis: Secondary | ICD-10-CM | POA: Insufficient documentation

## 2019-12-10 ENCOUNTER — Other Ambulatory Visit: Payer: Self-pay

## 2019-12-10 ENCOUNTER — Ambulatory Visit (INDEPENDENT_AMBULATORY_CARE_PROVIDER_SITE_OTHER): Payer: Managed Care, Other (non HMO) | Admitting: Cardiology

## 2019-12-10 ENCOUNTER — Encounter: Payer: Self-pay | Admitting: Cardiology

## 2019-12-10 VITALS — BP 128/84 | HR 94 | Ht 70.0 in | Wt 226.1 lb

## 2019-12-10 DIAGNOSIS — R079 Chest pain, unspecified: Secondary | ICD-10-CM | POA: Diagnosis not present

## 2019-12-10 DIAGNOSIS — E781 Pure hyperglyceridemia: Secondary | ICD-10-CM | POA: Diagnosis not present

## 2019-12-10 DIAGNOSIS — E785 Hyperlipidemia, unspecified: Secondary | ICD-10-CM | POA: Diagnosis not present

## 2019-12-10 DIAGNOSIS — Z1329 Encounter for screening for other suspected endocrine disorder: Secondary | ICD-10-CM | POA: Diagnosis not present

## 2019-12-10 NOTE — Progress Notes (Signed)
Cardiology Office Note:    Date:  12/10/2019   ID:  Carl Lane, DOB 1975/12/10, MRN [id number]  PCP:  Carollee Herter, Alferd Apa, DO  Cardiologist:  Jenean Lindau, MD   Referring MD: Carollee Herter, Alferd Apa, *    ASSESSMENT:    1. Hyperlipidemia LDL goal <100   2. Chest pain, unspecified type   3. Screening for thyroid disorder   4. Pure hypertriglyceridemia    PLAN:    In order of problems listed above:  1. Primary prevention stressed with the patient.  Importance of compliance with diet medication stressed any vocalized understanding.  He is happy to know that his calcium score is 0.  He is exercise pattern is excellent. 2. Mixed dyslipidemia with hypertriglyceridemia: He has done well with his diet.  He has not had any medications for this for the past 6 months so will have blood work in the next few days including fasting lipids and we will advise him appropriately.  He has had marked triglyceridemia in the past and I discussed with him diet low in carbohydrates and he vocalized understanding. 3. Patient will be seen in follow-up appointment in 6 months or earlier if the patient has any concerns   Medication Adjustments/Labs and Tests Ordered: Current medicines are reviewed at length with the patient today.  Concerns regarding medicines are outlined above.  Orders Placed This Encounter  Procedures  . Basic metabolic panel  . CBC with Differential/Platelet  . Hepatic function panel  . Lipid panel  . TSH  . EKG 12-Lead   No orders of the defined types were placed in this encounter.    No chief complaint on file.    History of Present Illness:    Carl Lane is a 44 y.o. male.  Patient has past medical history of hyperlipidemia with hypertriglyceridemia.  He denies any problems at this time and takes care of activities of daily living.  No chest pain orthopnea or PND.  He exercises appropriately on a daily basis.  At the time of my evaluation, the patient is  alert awake oriented and in no distress.  He has noticed a statin blood pressure over the past 6 months.  Past Medical History:  Diagnosis Date  . Abdominal pain, right lower quadrant 08/21/2006   Qualifier: Diagnosis of  By: Cletus Gash MD, [place]    . ABDOMINAL PAIN, UPPER 04/09/2009   Qualifier: Diagnosis of  By: Larose Kells MD, [place] Anxiety   . ANXIETY 03/12/2009   Qualifier: Diagnosis of  By: Linna Darner MD, [place]    . Backache 03/12/2009   Qualifier: Diagnosis of  By: Ronnald Ramp CMA, [place]    . BENIGN POSITIONAL VERTIGO, HX OF 10/04/2007   Qualifier: Diagnosis of  By: Jerold Coombe    . Biceps strain 01/26/2011  . Chest pain 12/11/2017  . GERD 05/22/2006   Qualifier: Diagnosis of  By: Cletus Gash MD, [place]    . GERD (gastroesophageal reflux disease)   . 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 Hyperlipidemia   . HYPERLIPIDEMIA 03/18/2008   Qualifier: Diagnosis of  By: Jerold Coombe    . Hyperlipidemia LDL goal <100 05/22/2017  . Injury of left toe 07/16/2012  . Left arm pain 05/22/2017  . OPEN WOUND FT NO TOE ALONE WITHOUT MENTION COMP 05/28/2009   Qualifier: Diagnosis of  By: Jerold Coombe    . ORCHITIS/EPIDIDYMITIS NOS 08/21/2006  Qualifier: Diagnosis of  By: Cletus Gash MD, [place]    . PANIC DISORDER 05/21/2006   Qualifier: Diagnosis of  By: Cletus Gash MD, [place]    . Preventative health care 12/10/2017  . Right elbow pain 11/14/2011  . Right shoulder pain 10/26/2014  . Vertigo 04/03/2016   Overview:   Formatting of this note might be different from the original.  . Viral syndrome 05/24/2010    Past Surgical History:  Procedure Laterality Date  . 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 ALPRAZolam (XANAX) 0.5 MG tablet Take 1 tablet by mouth 2 (two) times daily as needed.   Marland Kitchen FLUoxetine (PROZAC) 40 MG capsule Take 80 mg by mouth daily.    . rosuvastatin (CRESTOR) 10 MG tablet Take 1 tablet (10 mg total) by mouth daily. Please make annual appt for refills. Thank you  . SUMAtriptan (IMITREX) 50 MG tablet Take 1 tablet (50 mg total) by mouth every 2 (two) hours as needed for migraine. May repeat in 2 hours if headache persists or recurs.     Allergies:   Patient has no known allergies.   Social History   Socioeconomic History  . Marital status: Married    Spouse name: Not on file  . Number of children: 2  . Years of education: Not on file  . Highest education level: Not on file  Occupational History  . Occupation: Art gallery manager  Tobacco Use  . Smoking status: Never Smoker  . Smokeless tobacco: Never Used  Substance and Sexual Activity  . Alcohol use: Yes    Alcohol/week: 0.0 standard drinks    Comment: rarely  . Drug use: No  . Sexual activity: Not on file  Other Topics Concern  . Not on file  Social History Narrative   Regular exercise- intermittent   Social Determinants of Health   Financial Resource Strain:   . Difficulty of Paying Living Expenses: Not on file  Food Insecurity:   . Worried About Charity fundraiser in the Last Year: Not on file  . Ran Out of Food in the Last Year: Not on file  Transportation Needs:   . Lack of Transportation (Medical): Not on file  . Lack of Transportation (Non-Medical): Not on file  Physical Activity:   . Days of Exercise per Week: Not on file  . Minutes of Exercise per Session: Not on file  Stress:   . Feeling of Stress : Not on file  Social Connections:   . Frequency of Communication with Friends and Family: Not on file  . Frequency of Social Gatherings with Friends and Family: Not on file  . Attends Religious Services: Not on file  . Active Member of Clubs or Organizations: Not on file  . Attends Archivist Meetings: Not on file  . Marital Status: Not on file     Family History: The patient's family history includes Atrial fibrillation in his  mother; Heart attack in his cousin and maternal grandfather; Heart disease in his maternal uncle; Hyperlipidemia in his father; Hypertension in his father and another family member; Lupus in his sister; Other in his maternal aunt and sister. There is no history of Diabetes or Sudden death.  ROS:   Please see the history of present illness.    All other systems reviewed and are negative.  EKGs/Labs/Other Studies Reviewed:    The  following studies were reviewed today: EKG reveals sinus rhythm and nonspecific ST-T changes.   Recent Labs: 01/23/2019: ALT 24; BUN 14; Creatinine, Ser 1.05; Hemoglobin 15.5; Platelets 270.0; Potassium 3.8; Sodium 139  Recent Lipid Panel    Component Value Date/Time   CHOL 201 (H) 01/23/2019 1544   TRIG 389.0 (H) 01/23/2019 1544   HDL 30.60 (L) 01/23/2019 1544   CHOLHDL 7 01/23/2019 1544   VLDL 77.8 (H) 01/23/2019 1544   LDLCALC 147 (H) 05/22/2017 1536   LDLDIRECT 114.0 01/23/2019 1544    Physical Exam:    VS:  BP 128/84   Pulse 94   Ht 5\' 10"  (1.778 m)   Wt 226 lb 1.3 oz (102.5 kg)   SpO2 99%   BMI 32.44 kg/m     Wt Readings from Last 3 Encounters:  12/10/19 226 lb 1.3 oz (102.5 kg)  01/23/19 227 lb 6.4 oz (103.1 kg)  12/26/17 219 lb (99.3 kg)     GEN: Patient is in no acute distress HEENT: Normal NECK: No JVD; No carotid bruits LYMPHATICS: No lymphadenopathy CARDIAC: Hear sounds regular, 2/6 systolic murmur at the apex. RESPIRATORY:  Clear to auscultation without rales, wheezing or rhonchi  ABDOMEN: Soft, non-tender, non-distended MUSCULOSKELETAL:  No edema; No deformity  SKIN: Warm and dry NEUROLOGIC:  Alert and oriented x 3 PSYCHIATRIC:  Normal affect   Signed, Jenean Lindau, MD  12/10/2019 9:22 AM    [facility]

## 2019-12-10 NOTE — Patient Instructions (Signed)

## 2019-12-15 ENCOUNTER — Ambulatory Visit: Payer: Managed Care, Other (non HMO) | Admitting: Cardiology

## 2020-06-03 ENCOUNTER — Ambulatory Visit: Payer: BLUE CROSS/BLUE SHIELD | Admitting: Family Medicine

## 2020-06-03 ENCOUNTER — Encounter: Payer: Self-pay | Admitting: Family Medicine

## 2020-06-03 ENCOUNTER — Other Ambulatory Visit: Payer: Self-pay

## 2020-06-03 VITALS — BP 116/80 | HR 73 | Temp 98.1°F | Resp 18 | Ht 70.0 in | Wt 214.4 lb

## 2020-06-03 DIAGNOSIS — Z125 Encounter for screening for malignant neoplasm of prostate: Secondary | ICD-10-CM

## 2020-06-03 DIAGNOSIS — Z0184 Encounter for antibody response examination: Secondary | ICD-10-CM | POA: Diagnosis not present

## 2020-06-03 DIAGNOSIS — E785 Hyperlipidemia, unspecified: Secondary | ICD-10-CM | POA: Diagnosis not present

## 2020-06-03 DIAGNOSIS — Z0000 Encounter for general adult medical examination without abnormal findings: Secondary | ICD-10-CM | POA: Diagnosis not present

## 2020-06-03 LAB — CBC WITH DIFFERENTIAL/PLATELET
Basophils Absolute: 0 10*3/uL (ref 0.0–0.1)
Basophils Relative: 0.7 % (ref 0.0–3.0)
Eosinophils Absolute: 0.2 10*3/uL (ref 0.0–0.7)
Eosinophils Relative: 2.5 % (ref 0.0–5.0)
HCT: 43.1 % (ref 39.0–52.0)
Hemoglobin: 14.6 g/dL (ref 13.0–17.0)
Lymphocytes Relative: 25 % (ref 12.0–46.0)
Lymphs Abs: 1.6 10*3/uL (ref 0.7–4.0)
MCHC: 33.9 g/dL (ref 30.0–36.0)
MCV: 84.3 fl (ref 78.0–100.0)
Monocytes Absolute: 0.5 10*3/uL (ref 0.1–1.0)
Monocytes Relative: 8 % (ref 3.0–12.0)
Neutro Abs: 4.2 10*3/uL (ref 1.4–7.7)
Neutrophils Relative %: 63.8 % (ref 43.0–77.0)
Platelets: 247 10*3/uL (ref 150.0–400.0)
RBC: 5.11 Mil/uL (ref 4.22–5.81)
RDW: 13.2 % (ref 11.5–15.5)
WBC: 6.6 10*3/uL (ref 4.0–10.5)

## 2020-06-03 LAB — COMPREHENSIVE METABOLIC PANEL
ALT: 24 U/L (ref 0–53)
AST: 19 U/L (ref 0–37)
Albumin: 4.3 g/dL (ref 3.5–5.2)
Alkaline Phosphatase: 44 U/L (ref 39–117)
BUN: 17 mg/dL (ref 6–23)
CO2: 29 mEq/L (ref 19–32)
Calcium: 9.8 mg/dL (ref 8.4–10.5)
Chloride: 104 mEq/L (ref 96–112)
Creatinine, Ser: 1.09 mg/dL (ref 0.40–1.50)
GFR: 82.43 mL/min (ref >60.00)
Glucose, Bld: 87 mg/dL (ref 70–99)
Potassium: 4.6 mEq/L (ref 3.5–5.1)
Sodium: 138 mEq/L (ref 135–145)
Total Bilirubin: 1.8 mg/dL — ABNORMAL HIGH (ref 0.2–1.2)
Total Protein: 6.8 g/dL (ref 6.0–8.3)

## 2020-06-03 LAB — LIPID PANEL
Cholesterol: 171 mg/dL (ref 0–200)
HDL: 30.6 mg/dL — ABNORMAL LOW (ref >39.00)
LDL Cholesterol: 113 mg/dL — ABNORMAL HIGH (ref 0–99)
NonHDL: 140.67
Total CHOL/HDL Ratio: 6
Triglycerides: 140 mg/dL (ref 0.0–149.0)
VLDL: 28 mg/dL (ref 0.0–40.0)

## 2020-06-03 LAB — TSH: TSH: 1.3 u[IU]/mL (ref 0.35–4.50)

## 2020-06-03 LAB — PSA: PSA: 0.29 ng/mL (ref 0.10–4.00)

## 2020-06-03 NOTE — Patient Instructions (Signed)

## 2020-06-03 NOTE — Progress Notes (Signed)
Subjective:   By signing my name below, I, Carl Lane, attest that this documentation has been prepared under the direction and in the presence of Dr. Roma Schanz, DO. 06/03/2020      Patient ID: Carl Lane, male    DOB: 08-Feb-1975, 45 y.o.   MRN: [id number]  Chief Complaint  Patient presents with  . Forms    HPI Patient is in today for a office visit. 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 He mentions that these hikes are in high altitude environments. He's requesting a form for his trip be filled. He continues his diet and exercise and has lost 21 pounds. Wt Readings from Last 3 Encounters:  06/03/20 214 lb 6.4 oz (97.3 kg)  12/10/19 226 lb 1.3 oz (102.5 kg)  01/23/19 227 lb 6.4 oz (103.1 kg)   He denies having any dysuria, lumps in urine, hematuria and frequency at this time. He has a history of chicken pox around 1985. He last received a flu shot in February, 2022. He has received 2 Covid-19 vaccines but has not received his booster vaccines. He currently does not take 50 mg Imitrex PO as needed a this time. He has had no recent changes in his family history. He is UTD on vision care and dental care.   Past Medical History:  Diagnosis Date  . Abdominal pain, right lower quadrant 08/21/2006   Qualifier: Diagnosis of  By: Cletus Gash MD, [place]    . ABDOMINAL PAIN, UPPER 04/09/2009   Qualifier: Diagnosis of  By: Larose Kells MD, [place] Anxiety   . ANXIETY 03/12/2009   Qualifier: Diagnosis of  By: Linna Darner MD, [place]yn Saxon    . Backache 03/12/2009   Qualifier: Diagnosis of  By: Ronnald Ramp CMA, [place]    . BENIGN POSITIONAL VERTIGO, HX OF 10/04/2007   Qualifier: Diagnosis of  By: Jerold Coombe    . Biceps strain 01/26/2011  . Chest pain 12/11/2017  . GERD 05/22/2006   Qualifier: Diagnosis of  By: Cletus Gash MD, [place]    . GERD (gastroesophageal reflux disease)   . 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 Hyperlipidemia   . HYPERLIPIDEMIA 03/18/2008   Qualifier: Diagnosis of  By: Jerold Coombe    . Hyperlipidemia LDL goal <100 05/22/2017  . Injury of left toe 07/16/2012  . Left arm pain 05/22/2017  . OPEN WOUND FT NO TOE ALONE WITHOUT MENTION COMP 05/28/2009   Qualifier: Diagnosis of  By: Jerold Coombe    . ORCHITIS/EPIDIDYMITIS NOS 08/21/2006   Qualifier: Diagnosis of  By: Cletus Gash MD, [place]    . PANIC DISORDER 05/21/2006   Qualifier: Diagnosis of  By: Cletus Gash MD, [place]    . Preventative health care 12/10/2017  . Right elbow pain 11/14/2011  . Right shoulder pain 10/26/2014  . Vertigo 04/03/2016   Overview:   Formatting of this note might be different from the original.  . Viral syndrome 05/24/2010    Past Surgical History:  Procedure Laterality Date  . 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 Hypertension Other   . Hypertension Father   . Hyperlipidemia Father   . Lupus Sister   . Other Sister  bicuspid aortic disease and had it replaced at 20  . Atrial fibrillation Mother        treated with medication  . Heart attack Maternal Grandfather        died at 49  . Other Maternal Aunt        bicuspbid aortic disease and had it replaced at 58  . Heart attack Cousin        maternal cousin  . Heart disease Maternal Uncle        quadrupal bypass  . Diabetes Neg Hx   . Sudden death Neg Hx     Social History   Socioeconomic History  . Marital status: Married    Spouse name: Not on file  . Number of children: 2  . Years of education: Not on file  . Highest education level: Not on file  Occupational History  . Occupation: Art gallery manager  Tobacco Use  . Smoking status: Never Smoker  . Smokeless tobacco: Never Used  Substance and Sexual Activity  . Alcohol use: Yes    Alcohol/week: 0.0 standard drinks    Comment: rarely  . Drug  use: No  . Sexual activity: Not on file  Other Topics Concern  . Not on file  Social History Narrative   Regular exercise- intermittent   Social Determinants of Health   Financial Resource Strain: Not on file  Food Insecurity: Not on file  Transportation Needs: Not on file  Physical Activity: Not on file  Stress: Not on file  Social Connections: Not on file  Intimate Partner Violence: Not on file    Outpatient Medications Prior to Visit  Medication Sig Dispense Refill  . ALPRAZolam (XANAX) 0.5 MG tablet Take 1 tablet by mouth 2 (two) times daily as needed.     Marland Kitchen FLUoxetine (PROZAC) 40 MG capsule Take 80 mg by mouth daily.     . rosuvastatin (CRESTOR) 10 MG tablet Take 1 tablet (10 mg total) by mouth daily. Please make annual appt for refills. Thank you 30 tablet 0  . SUMAtriptan (IMITREX) 50 MG tablet Take 1 tablet (50 mg total) by mouth every 2 (two) hours as needed for migraine. May repeat in 2 hours if headache persists or recurs. 10 tablet 0   No facility-administered medications prior to visit.    No Known Allergies  Review of Systems  Genitourinary: Negative for dysuria, frequency and hematuria.       (-)Lumps during urination       Objective:    Physical Exam Constitutional:      General: He is not in acute distress.    Appearance: Normal appearance. He is not ill-appearing.  HENT:     Head: Normocephalic and atraumatic.     Right Ear: External ear normal.     Left Ear: External ear normal.  Eyes:     Extraocular Movements: Extraocular movements intact.     Pupils: Pupils are equal, round, and reactive to light.  Cardiovascular:     Rate and Rhythm: Normal rate and regular rhythm.     Pulses: Normal pulses.     Heart sounds: Normal heart sounds. No murmur heard. No gallop.   Pulmonary:     Effort: Pulmonary effort is normal. No respiratory distress.     Breath sounds: Normal breath sounds. No wheezing, rhonchi or rales.  Abdominal:     General: Bowel  sounds are normal. There is no distension.     Palpations: Abdomen is soft. There is no mass.  Tenderness: There is no abdominal tenderness. There is no guarding or rebound.     Hernia: No hernia is present.  Skin:    General: Skin is warm and dry.  Neurological:     Mental Status: He is alert and oriented to person, place, and time.  Psychiatric:        Behavior: Behavior normal.     BP 116/80 (BP Location: Right Arm, Patient Position: Sitting, Cuff Size: Normal)   Pulse 73   Temp 98.1 F (36.7 C) (Oral)   Resp 18   Ht 5\' 10"  (1.778 m)   Wt 214 lb 6.4 oz (97.3 kg)   SpO2 98%   BMI 30.76 kg/m  Wt Readings from Last 3 Encounters:  06/03/20 214 lb 6.4 oz (97.3 kg)  12/10/19 226 lb 1.3 oz (102.5 kg)  01/23/19 227 lb 6.4 oz (103.1 kg)    Diabetic Foot Exam - Simple   No data filed    Lab Results  Component Value Date   WBC 6.6 06/03/2020   HGB 14.6 06/03/2020   HCT 43.1 06/03/2020   PLT 247.0 06/03/2020   GLUCOSE 87 06/03/2020   CHOL 171 06/03/2020   TRIG 140.0 06/03/2020   HDL 30.60 (L) 06/03/2020   LDLDIRECT 114.0 01/23/2019   LDLCALC 113 (H) 06/03/2020   ALT 24 06/03/2020   AST 19 06/03/2020   NA 138 06/03/2020   K 4.6 06/03/2020   CL 104 06/03/2020   CREATININE 1.09 06/03/2020   BUN 17 06/03/2020   CO2 29 06/03/2020   TSH 1.30 06/03/2020   PSA 0.29 06/03/2020   MICROALBUR 0.9 01/23/2019    Lab Results  Component Value Date   TSH 1.30 06/03/2020   Lab Results  Component Value Date   WBC 6.6 06/03/2020   HGB 14.6 06/03/2020   HCT 43.1 06/03/2020   MCV 84.3 06/03/2020   PLT 247.0 06/03/2020   Lab Results  Component Value Date   NA 138 06/03/2020   K 4.6 06/03/2020   CO2 29 06/03/2020   GLUCOSE 87 06/03/2020   BUN 17 06/03/2020   CREATININE 1.09 06/03/2020   BILITOT 1.8 (H) 06/03/2020   ALKPHOS 44 06/03/2020   AST 19 06/03/2020   ALT 24 06/03/2020   PROT 6.8 06/03/2020   ALBUMIN 4.3 06/03/2020   CALCIUM 9.8 06/03/2020   GFR 82.43  06/03/2020   Lab Results  Component Value Date   CHOL 171 06/03/2020   Lab Results  Component Value Date   HDL 30.60 (L) 06/03/2020   Lab Results  Component Value Date   LDLCALC 113 (H) 06/03/2020   Lab Results  Component Value Date   TRIG 140.0 06/03/2020   Lab Results  Component Value Date   CHOLHDL 6 06/03/2020   No results found for: HGBA1C     Assessment & Plan:   Problem List Items Addressed This Visit      Unprioritized   Hyperlipidemia   Relevant Orders   Lipid panel (Completed)   Comprehensive metabolic panel (Completed)   Preventative health care - Primary    ghm utd Check labs  [employer] forms filled out  See AVS       Relevant Orders   PSA (Completed)   TSH (Completed)   CBC with Differential/Platelet (Completed)   Lipid panel (Completed)   Comprehensive metabolic panel (Completed)    Other Visit Diagnoses    Immunity status testing       Relevant Orders   Measles/Mumps/Rubella Immunity  Hepatitis B surface antibody,quantitative       No orders of the defined types were placed in this encounter.   I, Dr. Roma Schanz, DO, personally preformed the services described in this documentation.  All medical record entries made by the scribe were at my direction and in my presence.  I have reviewed the chart and discharge instructions (if applicable) and agree that the record reflects my personal performance and is accurate and complete. 06/03/2020   I,Carl Lane,acting as a scribe for Ann Held, DO.,have documented all relevant documentation on the behalf of Ann Held, DO,as directed by  Ann Held, DO while in the presence of Ann Held, DO.   Ann Held, DO

## 2020-06-03 NOTE — Assessment & Plan Note (Signed)
Encouraged heart healthy diet, increase exercise, avoid trans fats, consider a krill oil cap daily Pt has f/u cardiology

## 2020-06-03 NOTE — Assessment & Plan Note (Signed)
ghm utd Check labs  [employer] forms filled out  See AVS

## 2020-06-04 LAB — MEASLES/MUMPS/RUBELLA IMMUNITY
Mumps IgG: <9 AU/mL — ABNORMAL LOW
Rubella: >33 Index
Rubeola IgG: 21.3 AU/mL

## 2020-06-04 LAB — HEPATITIS B SURFACE ANTIBODY, QUANTITATIVE: Hep B S AB Quant (Post): <5 m[IU]/mL — ABNORMAL LOW (ref >=10)

## 2020-06-08 ENCOUNTER — Other Ambulatory Visit: Payer: Self-pay | Admitting: Family Medicine

## 2020-06-08 DIAGNOSIS — E785 Hyperlipidemia, unspecified: Secondary | ICD-10-CM

## 2020-06-09 ENCOUNTER — Ambulatory Visit: Payer: Managed Care, Other (non HMO) | Admitting: Cardiology

## 2020-06-09 ENCOUNTER — Other Ambulatory Visit: Payer: Self-pay

## 2020-06-09 ENCOUNTER — Encounter: Payer: Self-pay | Admitting: Cardiology

## 2020-06-09 VITALS — BP 104/72 | HR 60 | Ht 70.0 in | Wt 215.0 lb

## 2020-06-09 DIAGNOSIS — E6609 Other obesity due to excess calories: Secondary | ICD-10-CM

## 2020-06-09 DIAGNOSIS — E669 Obesity, unspecified: Secondary | ICD-10-CM

## 2020-06-09 DIAGNOSIS — E785 Hyperlipidemia, unspecified: Secondary | ICD-10-CM

## 2020-06-09 DIAGNOSIS — E66811 Obesity, class 1: Secondary | ICD-10-CM

## 2020-06-09 HISTORY — DX: Obesity, unspecified: E66.9

## 2020-06-09 HISTORY — DX: Other obesity due to excess calories: E66.09

## 2020-06-09 HISTORY — DX: Obesity, class 1: E66.811

## 2020-06-09 MED ORDER — ROSUVASTATIN CALCIUM 20 MG PO TABS: 20.0000 mg | ORAL_TABLET | Freq: Every day | ORAL | 2 refills | Status: DC

## 2020-06-09 NOTE — Progress Notes (Signed)
Cardiology Office Note:    Date:  06/09/2020   ID:  Carl Lane, DOB 1975-12-15, MRN [id number]  PCP:  Carollee Herter, Alferd Apa, DO  Cardiologist:  Jenean Lindau, MD   Referring MD: Carollee Herter, Alferd Apa, *    ASSESSMENT:    1. Hyperlipidemia LDL goal <100   2. Obesity (BMI 30.0-34.9)    PLAN:    In order of problems listed above:  1. Primary prevention stressed with the patient.  Importance of compliance with diet medication stressed and he vocalized understanding. 2. Mixed dyslipidemia: Diet was emphasized.  His lipids are excellent.  His calcium score couple of years ago was 0 therefore at this time I am not sure about the need for statin therapy.  I discussed diet and exercise and he understands.  He is not on statin therapy at this time and his lipids are acceptable and to be better. 3. Obesity: Weight reduction was stressed lifestyle modification was urged diet was emphasized and he promises to do better. 4. Patient will be seen in follow-up appointment in 12 months or earlier if the patient has any concerns    Medication Adjustments/Labs and Tests Ordered: Current medicines are reviewed at length with the patient today.  Concerns regarding medicines are outlined above.  No orders of the defined types were placed in this encounter.  No orders of the defined types were placed in this encounter.    Chief Complaint  Patient presents with  . Follow-up     History of Present Illness:    Carl Lane is a 45 y.o. male.  Patient has past medical history of hypertriglyceridemia and obesity.  He mentions to me that he has done well with diet and exercise and has reduced his numbers significantly.  He had a physical at his primary care and his numbers are elevated.  He denies any chest pain orthopnea or PND.  He exercises on a regular basis.  At the time of my evaluation, the patient is alert awake oriented and in no distress.  Past Medical History:  Diagnosis Date  .  Abdominal pain, right lower quadrant 08/21/2006   Qualifier: Diagnosis of  By: Cletus Gash MD, [place]    . ABDOMINAL PAIN, UPPER 04/09/2009   Qualifier: Diagnosis of  By: Larose Kells MD, [place] Anxiety   . ANXIETY 03/12/2009   Qualifier: Diagnosis of  By: Linna Darner MD, Gwyndolyn Saxon    . Backache 03/12/2009   Qualifier: Diagnosis of  By: Ronnald Ramp CMA, [place]    . BENIGN POSITIONAL VERTIGO, HX OF 10/04/2007   Qualifier: Diagnosis of  By: Jerold Coombe    . Biceps strain 01/26/2011  . Chest pain 12/11/2017  . GERD 05/22/2006   Qualifier: Diagnosis of  By: Cletus Gash MD, [place]    . GERD (gastroesophageal reflux disease)   . 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 Hyperlipidemia   . HYPERLIPIDEMIA 03/18/2008   Qualifier: Diagnosis of  By: Jerold Coombe    . Hyperlipidemia LDL goal <100 05/22/2017  . Injury of left toe 07/16/2012  . Left arm pain 05/22/2017  . OPEN WOUND FT NO TOE ALONE WITHOUT MENTION COMP 05/28/2009   Qualifier: Diagnosis of  By: Jerold Coombe    . ORCHITIS/EPIDIDYMITIS NOS 08/21/2006   Qualifier: Diagnosis of  By: Cletus Gash MD, [place]    . PANIC DISORDER 05/21/2006   Qualifier: Diagnosis of  By: Cletus Gash MD,  [place]    . Preventative health care 12/10/2017  . Right elbow pain 11/14/2011  . Right shoulder pain 10/26/2014  . Vertigo 04/03/2016   Overview:   Formatting of this note might be different from the original.  . Viral syndrome 05/24/2010    Past Surgical History:  Procedure Laterality Date  . 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 ALPRAZolam (XANAX) 0.5 MG tablet Take 1 tablet by mouth 2 (two) times daily as needed.   Marland Kitchen FLUoxetine (PROZAC) 40 MG capsule Take 80 mg by mouth daily.   . rosuvastatin (CRESTOR) 20 MG tablet Take 1 tablet (20 mg total) by mouth daily.     Allergies:   Patient has no known allergies.   Social  History   Socioeconomic History  . Marital status: Married    Spouse name: Not on file  . Number of children: 2  . Years of education: Not on file  . Highest education level: Not on file  Occupational History  . Occupation: Art gallery manager  Tobacco Use  . Smoking status: Never Smoker  . Smokeless tobacco: Never Used  Substance and Sexual Activity  . Alcohol use: Yes    Alcohol/week: 0.0 standard drinks    Comment: rarely  . Drug use: No  . Sexual activity: Not on file  Other Topics Concern  . Not on file  Social History Narrative   Regular exercise- intermittent   Social Determinants of Health   Financial Resource Strain: Not on file  Food Insecurity: Not on file  Transportation Needs: Not on file  Physical Activity: Not on file  Stress: Not on file  Social Connections: Not on file     Family History: The patient's family history includes Atrial fibrillation in his mother; Heart attack in his cousin and maternal grandfather; Heart disease in his maternal uncle; Hyperlipidemia in his father; Hypertension in his father and another family member; Lupus in his sister; Other in his maternal aunt and sister. There is no history of Diabetes or Sudden death.  ROS:   Please see the history of present illness.    All other systems reviewed and are negative.  EKGs/Labs/Other Studies Reviewed:    The following studies were reviewed today: I discussed my findings with the patient at extensive length.   Recent Labs: 06/03/2020: ALT 24; BUN 17; Creatinine, Ser 1.09; Hemoglobin 14.6; Platelets 247.0; Potassium 4.6; Sodium 138; TSH 1.30  Recent Lipid Panel    Component Value Date/Time   CHOL 171 06/03/2020 1004   TRIG 140.0 06/03/2020 1004   HDL 30.60 (L) 06/03/2020 1004   CHOLHDL 6 06/03/2020 1004   VLDL 28.0 06/03/2020 1004   LDLCALC 113 (H) 06/03/2020 1004   LDLDIRECT 114.0 01/23/2019 1544    Physical Exam:    VS:  BP 104/72 (BP Location: Right Arm, Patient Position:  Sitting, Cuff Size: Normal)   Pulse 60   Ht 5\' 10"  (1.778 m)   Wt 215 lb (97.5 kg)   SpO2 98%   BMI 30.85 kg/m     Wt Readings from Last 3 Encounters:  06/09/20 215 lb (97.5 kg)  06/03/20 214 lb 6.4 oz (97.3 kg)  12/10/19 226 lb 1.3 oz (102.5 kg)     GEN: Patient is in no acute distress HEENT: Normal NECK: No JVD; No carotid bruits LYMPHATICS: No lymphadenopathy CARDIAC: Hear sounds regular, 2/6  systolic murmur at the apex. RESPIRATORY:  Clear to auscultation without rales, wheezing or rhonchi  ABDOMEN: Soft, non-tender, non-distended MUSCULOSKELETAL:  No edema; No deformity  SKIN: Warm and dry NEUROLOGIC:  Alert and oriented x 3 PSYCHIATRIC:  Normal affect   Signed, Jenean Lindau, MD  06/09/2020 9:45 AM    [place]

## 2020-06-09 NOTE — Patient Instructions (Signed)
Medication Instructions:  No medication changes. *If you need a refill on your cardiac medications before your next appointment, please call your pharmacy*   Lab Work: None ordered If you have labs (blood work) drawn today and your tests are completely normal, you will receive your results only by: Marland Kitchen MyChart Message (if you have MyChart) OR . A paper copy in the mail If you have any lab test that is abnormal or we need to change your treatment, we will call you to review the results.   Testing/Procedures: None ordered   Follow-Up: At [facility], you and your health needs are our priority.  As part of our continuing mission to provide you with exceptional heart care, we have created designated Provider Care Teams.  These Care Teams include your primary Cardiologist (physician) and Advanced Practice Providers (APPs -  Physician Assistants and Nurse Practitioners) who all work together to provide you with the care you need, when you need it.  We recommend signing up for the patient portal called "MyChart".  Sign up information is provided on this After Visit Summary.  MyChart is used to connect with patients for Virtual Visits (Telemedicine).  Patients are able to view lab/test results, encounter notes, upcoming appointments, etc.  Non-urgent messages can be sent to your provider as well.   To learn more about what you can do with MyChart, go to [email or web address].    Your next appointment:   12 month(s)  The format for your next appointment:   In Person  Provider:   Jyl Heinz, MD   Other Instructions NA

## 2020-06-27 DIAGNOSIS — I472 Ventricular tachycardia, unspecified: Secondary | ICD-10-CM

## 2020-06-30 ENCOUNTER — Telehealth (HOSPITAL_COMMUNITY): Payer: Self-pay | Admitting: *Deleted

## 2020-06-30 NOTE — Telephone Encounter (Signed)
Close encounter 

## 2020-07-01 ENCOUNTER — Ambulatory Visit (HOSPITAL_COMMUNITY)
Admission: RE | Admit: 2020-07-01 | Discharge: 2020-07-01 | Disposition: A | Discharge status: Home / Self Care | Payer: BLUE CROSS/BLUE SHIELD | Source: Ambulatory Visit | Attending: Cardiovascular Disease | Admitting: Cardiovascular Disease

## 2020-07-01 ENCOUNTER — Other Ambulatory Visit: Payer: Self-pay

## 2020-07-01 DIAGNOSIS — I472 Ventricular tachycardia, unspecified: Secondary | ICD-10-CM

## 2020-07-01 LAB — EXERCISE TOLERANCE TEST
Estimated workload: 12.4 METS
Exercise duration (min): 10 min
Exercise duration (sec): 21 s
MPHR: 176 {beats}/min
Peak HR: 160 {beats}/min
Percent HR: 90 %
Rest HR: 76 {beats}/min

## 2020-12-07 ENCOUNTER — Ambulatory Visit: Payer: BLUE CROSS/BLUE SHIELD | Admitting: Family Medicine

## 2020-12-07 ENCOUNTER — Other Ambulatory Visit: Payer: Self-pay

## 2020-12-07 ENCOUNTER — Encounter: Payer: Self-pay | Admitting: Family Medicine

## 2020-12-07 VITALS — BP 118/70 | HR 72 | Temp 98.0°F | Ht 70.0 in | Wt 233.4 lb

## 2020-12-07 DIAGNOSIS — H6983 Other specified disorders of Eustachian tube, bilateral: Secondary | ICD-10-CM | POA: Diagnosis not present

## 2020-12-07 DIAGNOSIS — J069 Acute upper respiratory infection, unspecified: Secondary | ICD-10-CM | POA: Diagnosis not present

## 2020-12-07 LAB — POC COVID19 BINAXNOW: SARS Coronavirus 2 Ag: NEGATIVE

## 2020-12-07 LAB — POCT INFLUENZA A/B
Influenza A, POC: NEGATIVE
Influenza B, POC: NEGATIVE

## 2020-12-07 MED ORDER — PREDNISONE 20 MG PO TABS: 40.0000 mg | ORAL_TABLET | Freq: Every day | ORAL | 0 refills | Status: AC

## 2020-12-07 NOTE — Progress Notes (Signed)
Chief Complaint  Patient presents with   Sore Throat   Cough    Headache Ear pain No fever    Wheezing    Carl Lane here for URI complaints.  Duration: 3 days  Associated symptoms: Fever (99 F) that resolved, sinus congestion, rhinorrhea, ear pain, sore throat, wheezing, myalgia, and coughing Denies: sinus pain, ear drainage, shortness of breath, and N/V/D, loss of taste/smell Treatment to date: Theraflu, Tylenol, ibuprofen Sick contacts: No Tested neg for covid x 2 at home.   Past Medical History:  Diagnosis Date   Abdominal pain, right lower quadrant 08/21/2006   Qualifier: Diagnosis of  By: Cletus Gash MD, [place], UPPER 04/09/2009   Qualifier: Diagnosis of  By: Larose Kells MD, [place]    Anxiety    ANXIETY 03/12/2009   Qualifier: Diagnosis of  By: Linna Darner MD, Gwyndolyn Saxon     Backache 03/12/2009   Qualifier: Diagnosis of  By: Ronnald Ramp CMA, [place]     BENIGN POSITIONAL VERTIGO, HX OF 10/04/2007   Qualifier: Diagnosis of  By: Etter Sjogren DO, [place]     Biceps strain 01/26/2011   Chest pain 12/11/2017   GERD 05/22/2006   Qualifier: Diagnosis of  By: Cletus Gash MD, [place]     GERD (gastroesophageal reflux disease)    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   Qualifier: Diagnosis of  By: Cletus Gash MD, [place]     Hyperlipidemia    HYPERLIPIDEMIA 03/18/2008   Qualifier: Diagnosis of  By: Jerold Coombe     Hyperlipidemia LDL goal <100 05/22/2017   Injury of left toe 07/16/2012   Left arm pain 05/22/2017   OPEN WOUND FT NO TOE ALONE WITHOUT MENTION COMP 05/28/2009   Qualifier: Diagnosis of  By: Jerold Coombe     ORCHITIS/EPIDIDYMITIS NOS 08/21/2006   Qualifier: Diagnosis of  By: Cletus Gash MD, [place]elt[place] DISORDER 05/21/2006   Qualifier: Diagnosis of  By: Cletus Gash MD, [place]     Preventative health care 12/10/2017   Right elbow pain 11/14/2011   Right shoulder pain 10/26/2014   Vertigo 04/03/2016   Overview:   Formatting of this note might be different from the original.   Viral syndrome  05/24/2010    Objective BP 118/70   Pulse 72   Temp 98 F (36.7 C) (Oral)   Ht 5\' 10"  (1.778 m)   Wt 233 lb 6 oz (105.9 kg)   SpO2 99%   BMI 33.49 kg/m  General: Awake, alert, appears stated age HEENT: AT, [place], ears patent b/l and TM neg on R, retracted on L, nares patent w/o discharge, pharynx erythematous and without exudates, MMM Neck: No masses or asymmetry; no tender cerv adenopathy Heart: RRR Lungs: CTAB, no accessory muscle use Psych: Age appropriate judgment and insight, normal mood and affect  Viral URI with cough - Plan: POCT Influenza A/B, POC COVID-19  Dysfunction of both eustachian tubes - Plan: predniSONE (DELTASONE) 20 MG tablet  Likely RSV given community transmission. Pred burst for 5 d, 40 mg/d for wheezing and ETD. He politely declined a cough suppressant. Covid and flu testing neg.  Continue to push fluids, practice good hand hygiene, cover mouth when coughing. F/u prn. If starting to experience fevers, shaking, or shortness of breath, seek immediate care. Pt voiced understanding and agreement to the plan.  [place], DO 12/07/20 9:25 AM

## 2020-12-07 NOTE — Patient Instructions (Signed)
OK to take Tylenol 1000 mg (2 extra strength tabs) or 975 mg (3 regular strength tabs) every 6 hours as needed.  Continue to push fluids, practice good hand hygiene, and cover your mouth if you cough.  If you start having fevers, shaking or shortness of breath, seek immediate care.  Let us know if you need anything.

## 2020-12-16 ENCOUNTER — Encounter: Payer: Self-pay | Admitting: Family Medicine

## 2020-12-17 ENCOUNTER — Other Ambulatory Visit: Payer: Self-pay | Admitting: Family Medicine

## 2020-12-17 MED ORDER — AZITHROMYCIN 250 MG PO TABS: ORAL_TABLET | ORAL | 0 refills | Status: DC

## 2021-01-20 ENCOUNTER — Encounter: Payer: Self-pay | Admitting: Family Medicine

## 2021-01-20 ENCOUNTER — Ambulatory Visit: Payer: BLUE CROSS/BLUE SHIELD | Admitting: Family Medicine

## 2021-01-20 ENCOUNTER — Other Ambulatory Visit: Payer: Self-pay

## 2021-01-20 ENCOUNTER — Ambulatory Visit: Payer: Self-pay

## 2021-01-20 ENCOUNTER — Ambulatory Visit (HOSPITAL_BASED_OUTPATIENT_CLINIC_OR_DEPARTMENT_OTHER)
Admission: RE | Admit: 2021-01-20 | Discharge: 2021-01-20 | Disposition: A | Discharge status: Home / Self Care | Payer: BLUE CROSS/BLUE SHIELD | Source: Ambulatory Visit | Attending: Family Medicine | Admitting: Family Medicine

## 2021-01-20 VITALS — BP 130/80 | Ht 70.0 in | Wt 233.0 lb

## 2021-01-20 DIAGNOSIS — S92511A Displaced fracture of proximal phalanx of right lesser toe(s), initial encounter for closed fracture: Secondary | ICD-10-CM

## 2021-01-20 DIAGNOSIS — M79674 Pain in right toe(s): Secondary | ICD-10-CM

## 2021-01-20 DIAGNOSIS — S93509A Unspecified sprain of unspecified toe(s), initial encounter: Secondary | ICD-10-CM | POA: Insufficient documentation

## 2021-01-20 HISTORY — DX: Displaced fracture of proximal phalanx of right lesser toe(s), initial encounter for closed fracture: S92.511A

## 2021-01-20 NOTE — Patient Instructions (Signed)
Nice to meet you Please try the shoe  Please use ice  I will call with the results.   Please send me a message in MyChart with any questions or updates.  Please see me back in 3 weeks or as needed if better.   --Dr. Raeford Razor

## 2021-01-20 NOTE — Assessment & Plan Note (Signed)
Injury occurred on 1/11.  X-ray was confirming the fracture. -Counseled on home exercise therapy and supportive care. -Postop shoe. -X-ray.

## 2021-01-20 NOTE — Progress Notes (Signed)
°  Carl Lane - 46 y.o. male MRN [id number]  Date of birth: 1975/03/12  SUBJECTIVE:  Including CC & ROS.  No chief complaint on file.   Carl Lane is a 46 y.o. male that is presenting with right toe pain after an injury at jujitsu last night.  It is localized to the base of the third phalanx.  Having bruising and swelling in this area.  It is worse with walking.    Review of Systems See HPI   HISTORY: Past Medical, Surgical, Social, and Family History Reviewed & Updated per EMR.   Pertinent Historical Findings include:  Past Medical History:  Diagnosis Date   Abdominal pain, right lower quadrant 08/21/2006   Qualifier: Diagnosis of  By: Cletus Gash MD, [place], UPPER 04/09/2009   Qualifier: Diagnosis of  By: Larose Kells MD, [place]    Anxiety    ANXIETY 03/12/2009   Qualifier: Diagnosis of  By: Linna Darner MD, [place]wyndolyn Saxon     Backache 03/12/2009   Qualifier: Diagnosis of  By: Ronnald Ramp CMA, [place]     BENIGN POSITIONAL VERTIGO, HX OF 10/04/2007   Qualifier: Diagnosis of  By: Etter Sjogren DO, [place]     Biceps strain 01/26/2011   Chest pain 12/11/2017   GERD 05/22/2006   Qualifier: Diagnosis of  By: Cletus Gash MD, [place]     GERD (gastroesophageal reflux disease)    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   Qualifier: Diagnosis of  By: Cletus Gash MD, [place]     Hyperlipidemia    HYPERLIPIDEMIA 03/18/2008   Qualifier: Diagnosis of  By: Jerold Coombe     Hyperlipidemia LDL goal <100 05/22/2017   Injury of left toe 07/16/2012   Left arm pain 05/22/2017   OPEN WOUND FT NO TOE ALONE WITHOUT MENTION COMP 05/28/2009   Qualifier: Diagnosis of  By: Jerold Coombe     ORCHITIS/EPIDIDYMITIS NOS 08/21/2006   Qualifier: Diagnosis of  By: Cletus Gash MD, Excell Seltzer DISORDER 05/21/2006   Qualifier: Diagnosis of  By: Cletus Gash MD, [place]     Preventative health care 12/10/2017   Right elbow pain 11/14/2011   Right shoulder pain 10/26/2014   Vertigo 04/03/2016   Overview:   Formatting of this note might be different from  the original.   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  Ultrasound and interpretation by Clearance Coots, MD    ASSESSMENT & PLAN:   Closed displaced fracture of proximal phalanx of lesser toe of right foot Injury occurred on 1/11.  X-ray was confirming the fracture. -Counseled on home exercise therapy and supportive care. -Postop shoe. -X-ray.

## 2021-01-24 ENCOUNTER — Ambulatory Visit: Payer: BLUE CROSS/BLUE SHIELD | Admitting: Family Medicine

## 2021-01-25 ENCOUNTER — Encounter: Payer: Self-pay | Admitting: Family Medicine

## 2021-02-16 ENCOUNTER — Encounter: Payer: Self-pay | Admitting: Family Medicine

## 2021-02-16 DIAGNOSIS — Z0189 Encounter for other specified special examinations: Secondary | ICD-10-CM

## 2021-03-02 ENCOUNTER — Ambulatory Visit (INDEPENDENT_AMBULATORY_CARE_PROVIDER_SITE_OTHER): Payer: BLUE CROSS/BLUE SHIELD | Admitting: Primary Care

## 2021-03-02 ENCOUNTER — Encounter: Payer: Self-pay | Admitting: Primary Care

## 2021-03-02 ENCOUNTER — Other Ambulatory Visit: Payer: Self-pay

## 2021-03-02 VITALS — BP 126/82 | HR 57 | Ht 71.0 in | Wt 234.6 lb

## 2021-03-02 DIAGNOSIS — G4700 Insomnia, unspecified: Secondary | ICD-10-CM

## 2021-03-02 DIAGNOSIS — E66811 Obesity, class 1: Secondary | ICD-10-CM

## 2021-03-02 DIAGNOSIS — E669 Obesity, unspecified: Secondary | ICD-10-CM

## 2021-03-02 DIAGNOSIS — R0683 Snoring: Secondary | ICD-10-CM

## 2021-03-02 HISTORY — DX: Insomnia, unspecified: G47.00

## 2021-03-02 HISTORY — DX: Snoring: R06.83

## 2021-03-02 NOTE — Assessment & Plan Note (Signed)
-   Long standing hx insomnia. He has tried melatonin and Trazodone in the past with unwanted side effects. He was prescribed ambien but did not want to take this. Recommend he try taking diphenhydramine 25-50mg  at bedtime as needed.

## 2021-03-02 NOTE — Assessment & Plan Note (Signed)
-   Encourage weight loss efforts  ?

## 2021-03-02 NOTE — Patient Instructions (Addendum)
Risk of untreated sleep apnea include cardiac arrhythmias, stroke, pulmonary hypertension and diabetes  Treatment options include weight loss, oral appliance, CPAP therapy or referral to ear nose and throat for possible surgical options  Orders: Home sleep study re: snoring   Follow-up: 4 to 6 weeks virtual video visit or in person to review sleep study results and discuss treatment options further  Sleep Apnea Sleep apnea is a condition in which breathing pauses or becomes shallow during sleep. People with sleep apnea usually snore loudly. They may have times when they gasp and stop breathing for 10 seconds or more during sleep. This may happen many times during the night. Sleep apnea disrupts your sleep and keeps your body from getting the rest that it needs. This condition can increase your risk of certain health problems, including: Heart attack. Stroke. Obesity. Type 2 diabetes. Heart failure. Irregular heartbeat. High blood pressure. The goal of treatment is to help you breathe normally again. What are the causes? The most common cause of sleep apnea is a collapsed or blocked airway. There are three kinds of sleep apnea: Obstructive sleep apnea. This kind is caused by a blocked or collapsed airway. Central sleep apnea. This kind happens when the part of the brain that controls breathing does not send the correct signals to the muscles that control breathing. Mixed sleep apnea. This is a combination of obstructive and central sleep apnea. What increases the risk? You are more likely to develop this condition if you: Are overweight. Smoke. Have a smaller than normal airway. Are older. Are male. Drink alcohol. Take sedatives or tranquilizers. Have a family history of sleep apnea. Have a tongue or tonsils that are larger than normal. What are the signs or symptoms? Symptoms of this condition include: Trouble staying asleep. Loud snoring. Morning headaches. Waking up  gasping. Dry mouth or sore throat in the morning. Daytime sleepiness and tiredness. If you have daytime fatigue because of sleep apnea, you may be more likely to have: Trouble concentrating. Forgetfulness. Irritability or mood swings. Personality changes. Feelings of depression. Sexual dysfunction. This may include loss of interest if you are male, or erectile dysfunction if you are male. How is this diagnosed? This condition may be diagnosed with: A medical history. A physical exam. A series of tests that are done while you are sleeping (sleep study). These tests are usually done in a sleep lab, but they may also be done at home. How is this treated? Treatment for this condition aims to restore normal breathing and to ease symptoms during sleep. It may involve managing health issues that can affect breathing, such as high blood pressure or obesity. Treatment may include: Sleeping on your side. Using a decongestant if you have nasal congestion. Avoiding the use of depressants, including alcohol, sedatives, and narcotics. Losing weight if you are overweight. Making changes to your diet. Quitting smoking. Using a device to open your airway while you sleep, such as: An oral appliance. This is a custom-made mouthpiece that shifts your lower jaw forward. A continuous positive airway pressure (CPAP) device. This device blows air through a mask when you breathe out (exhale). A nasal expiratory positive airway pressure (EPAP) device. This device has valves that you put into each nostril. A bi-level positive airway pressure (BIPAP) device. This device blows air through a mask when you breathe in (inhale) and breathe out (exhale). Having surgery if other treatments do not work. During surgery, excess tissue is removed to create a wider airway. Follow these  instructions at home: Lifestyle Make any lifestyle changes that your health care provider recommends. Eat a healthy, well-balanced  diet. Take steps to lose weight if you are overweight. Avoid using depressants, including alcohol, sedatives, and narcotics. Do not use any products that contain nicotine or tobacco. These products include cigarettes, chewing tobacco, and vaping devices, such as e-cigarettes. If you need help quitting, ask your health care provider. General instructions Take over-the-counter and prescription medicines only as told by your health care provider. If you were given a device to open your airway while you sleep, use it only as told by your health care provider. If you are having surgery, make sure to tell your health care provider you have sleep apnea. You may need to bring your device with you. Keep all follow-up visits. This is important. Contact a health care provider if: The device that you received to open your airway during sleep is uncomfortable or does not seem to be working. Your symptoms do not improve. Your symptoms get worse. Get help right away if: You develop: Chest pain. Shortness of breath. Discomfort in your back, arms, or stomach. You have: Trouble speaking. Weakness on one side of your body. Drooping in your face. These symptoms may represent a serious problem that is an emergency. Do not wait to see if the symptoms will go away. Get medical help right away. Call your local emergency services (911 in the [place] Do not drive yourself to the hospital. Summary Sleep apnea is a condition in which breathing pauses or becomes shallow during sleep. The most common cause is a collapsed or blocked airway. The goal of treatment is to restore normal breathing and to ease symptoms during sleep. This information is not intended to replace advice given to you by your health care provider. Make sure you discuss any questions you have with your health care provider. Document Revised: 08/04/2020 Document Reviewed: 12/05/2019 Elsevier Patient Education  2022 [employer].

## 2021-03-02 NOTE — Progress Notes (Signed)
Reviewed and agree with assessment/plan.   Chesley Mires, MD [facility] Pulmonary/Critical Care 03/02/2021, 4:35 PM Pager:  [phone]

## 2021-03-02 NOTE — Progress Notes (Signed)
_0  ID: Carl Lane, male    DOB: April 04, 1975, 46 y.o.   MRN: [id number]  Chief Complaint  Patient presents with   Sleep Apnea    Pt snores      Referring provider: Colon Branch, MD  HPI: 46 year old male, never smoked.  Past medical history significant for hyperlipidemia, GERD, anxiety.   03/02/2021 Patient presents today for sleep consult. Patient has symptoms of loud snoring, waking up gasping for breath and restless sleep. He has had insomnia for several years. He gets average 4-6 hours of sleep a night. He has tried Trazodone but this gave him a hangover effect the next day. He was too nervous to take Ambien. He will occasionally take melatonin 55m but has noticed it makes his apnea worse. His weight tends to fluctuate between 200-230lbs. Epworth 10. Denies narcolepsy, cataplexy, sleep walking.   Sleep questionnaire Symptoms- Loud snoring, gasping for air, insomnia  Prior sleep study- None  Bedtime- 11-1am Time to fall asleep- longer than he would like  Nocturnal awakenings- every few hours Time out of bed-5:30am  Weight changes- fluctuates  Epworth- 10   No Known Allergies  Immunization History  Administered Date(s) Administered   Influenza Split 12/12/2010   Influenza Whole 11/25/2009   PFIZER(Purple Top)SARS-COV-2 Vaccination 04/03/2019, 04/22/2019   Td 08/21/2006   Tdap 02/03/2016    Past Medical History:  Diagnosis Date   Abdominal pain, right lower quadrant 08/21/2006   Qualifier: Diagnosis of  By: ACletus GashMD, [place] UPPER 04/09/2009   Qualifier: Diagnosis of  By: PLarose KellsMD, JWann   Anxiety    ANXIETY 03/12/2009   Qualifier: Diagnosis of  By: HLinna DarnerMD, WGwyndolyn Saxon    Backache 03/12/2009   Qualifier: Diagnosis of  By: JRonnald RampCMA, [place]     BENIGN POSITIONAL VERTIGO, HX OF 10/04/2007   Qualifier: Diagnosis of  By: LEtter SjogrenDO, Yvonne     Biceps strain 01/26/2011   Chest pain 12/11/2017   GERD 05/22/2006   Qualifier: Diagnosis of  By:  ACletus GashMD, Luis     GERD (gastroesophageal reflux disease)    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   Qualifier: Diagnosis of  By: ACletus GashMD, L[place]rell    Hyperlipidemia    HYPERLIPIDEMIA 03/18/2008   Qualifier: Diagnosis of  By: LJerold Coombe    Hyperlipidemia LDL goal <100 05/22/2017   Injury of left toe 07/16/2012   Left arm pain 05/22/2017   OPEN WOUND FT NO TOE ALONE WITHOUT MENTION COMP 05/28/2009   Qualifier: Diagnosis of  By: LJerold Coombe    ORCHITIS/EPIDIDYMITIS NOS 08/21/2006   Qualifier: Diagnosis of  By: ACletus GashMD, LExcell SeltzerDISORDER 05/21/2006   Qualifier: Diagnosis of  By: ACletus GashMD, [place]    Preventative health care 12/10/2017   Right elbow pain 11/14/2011   Right shoulder pain 10/26/2014   Vertigo 04/03/2016   Overview:   Formatting of this note might be different from the original.   Viral syndrome 05/24/2010    Tobacco History: Social History   Tobacco Use  Smoking Status Never  Smokeless Tobacco Never   Counseling given: Not Answered   Outpatient Medications Prior to Visit  Medication Sig Dispense Refill   ALPRAZolam (XANAX) 0.5 MG tablet Take 1 tablet by mouth 2 (two) times daily as needed.      FLUoxetine (PROZAC) 40 MG capsule Take 80 mg by mouth daily.  ALPRAZolam (XANAX) 1 MG tablet Take 1 mg by mouth 3 (three) times daily as needed.     Fluoxetine HCl, PMDD, 20 MG TABS Take by mouth.     azithromycin (ZITHROMAX) 250 MG tablet Take 2 tabs the first day and then 1 tab daily until you run out. 6 tablet 0   No facility-administered medications prior to visit.   Review of Systems  Review of Systems  Constitutional: Negative.   HENT: Negative.    Respiratory: Negative.    Psychiatric/Behavioral:  Positive for sleep disturbance.     Physical Exam  BP 126/82    Pulse (!) 57    Ht _0  (1.803 m)    Wt 234 lb 9.6 oz (106.4 kg)    SpO2 96%    BMI 32.72 kg/m  Physical Exam Constitutional:      Appearance: Normal appearance.  HENT:      Head: Normocephalic and atraumatic.     Mouth/Throat:     Mouth: Mucous membranes are moist.     Pharynx: Oropharynx is clear.     Comments: Mallampati class II Cardiovascular:     Rate and Rhythm: Normal rate and regular rhythm.  Pulmonary:     Effort: Pulmonary effort is normal.     Breath sounds: Normal breath sounds. No wheezing, rhonchi or rales.  Musculoskeletal:        General: Normal range of motion.  Skin:    General: Skin is warm and dry.  Neurological:     General: No focal deficit present.     Mental Status: He is alert and oriented to person, place, and time. Mental status is at baseline.  Psychiatric:        Mood and Affect: Mood normal.        Behavior: Behavior normal.        Thought Content: Thought content normal.        Judgment: Judgment normal.     Lab Results:  CBC    Component Value Date/Time   WBC 6.6 06/03/2020 1004   RBC 5.11 06/03/2020 1004   HGB 14.6 06/03/2020 1004   HCT 43.1 06/03/2020 1004   PLT 247.0 06/03/2020 1004   MCV 84.3 06/03/2020 1004   MCHC 33.9 06/03/2020 1004   RDW 13.2 06/03/2020 1004   LYMPHSABS 1.6 06/03/2020 1004   MONOABS 0.5 06/03/2020 1004   EOSABS 0.2 06/03/2020 1004   BASOSABS 0.0 06/03/2020 1004    BMET    Component Value Date/Time   NA 138 06/03/2020 1004   K 4.6 06/03/2020 1004   CL 104 06/03/2020 1004   CO2 29 06/03/2020 1004   GLUCOSE 87 06/03/2020 1004   BUN 17 06/03/2020 1004   CREATININE 1.09 06/03/2020 1004   CALCIUM 9.8 06/03/2020 1004   GFRNONAA >60 04/09/2009 1522   GFRAA  04/09/2009 1522    >60        The eGFR has been calculated using the MDRD equation. This calculation has not been validated in all clinical situations. eGFR's persistently <60 mL/min signify possible Chronic Kidney Disease.    BNP No results found for: BNP  ProBNP No results found for: PROBNP  Imaging: No results found.   Assessment & Plan:   Loud snoring - Patient has symptoms of loud snoring and  witnessed apnea. Epworth 10. Concern patient could have sleep apnea, needs home sleep study to evaluate. Discussed risk of untreated sleep apnea including cardiac arrhthymias, stroke, pulm HTN, diabetes. Briefly reviewed treatment options. Encourage  side sleeping position. Advised against driving if experiencing excessive daytime sleepiness. FU in 4-6 weeks virtual or in person to review sleep study results and treatment options further. He is open to trying CPAP therapy if needed as treatment for OSA.   Obesity (BMI 30.0-34.9) - Encourage weight loss efforts   Insomnia - Long standing hx insomnia. He has tried melatonin and Trazodone in the past with unwanted side effects. He was prescribed ambien but did not want to take this. Recommend he try taking diphenhydramine 25-13m at bedtime as needed.    EMartyn Ehrich NP 03/02/2021

## 2021-03-02 NOTE — Assessment & Plan Note (Addendum)
-   Patient has symptoms of loud snoring and witnessed apnea. Epworth 10. Concern patient could have sleep apnea, needs home sleep study to evaluate. Discussed risk of untreated sleep apnea including cardiac arrhthymias, stroke, pulm HTN, diabetes. Briefly reviewed treatment options. Encourage side sleeping position. Advised against driving if experiencing excessive daytime sleepiness. FU in 4-6 weeks virtual or in person to review sleep study results and treatment options further. He is open to trying CPAP therapy if needed as treatment for OSA.

## 2021-04-13 ENCOUNTER — Telehealth: Payer: BLUE CROSS/BLUE SHIELD | Admitting: Pulmonary Disease

## 2021-04-21 ENCOUNTER — Encounter: Payer: Self-pay | Admitting: Primary Care

## 2021-05-19 ENCOUNTER — Ambulatory Visit: Payer: BLUE CROSS/BLUE SHIELD

## 2021-05-19 DIAGNOSIS — R0683 Snoring: Secondary | ICD-10-CM

## 2021-05-20 DIAGNOSIS — R0683 Snoring: Secondary | ICD-10-CM | POA: Diagnosis not present

## 2021-05-30 NOTE — Telephone Encounter (Signed)
Beth, please see pt's latest email regarding his insomnia. Thanks.

## 2021-05-30 NOTE — Telephone Encounter (Signed)
He had a total of 8 apneas and 24 hyponeas, total index score was 4.4/hr. Not frequent enough for dx sleep apnea. Mild OSA is 5-15 event on average an hour. CPAP is not needed. How is his insomnia? Is he taking anything currently

## 2021-05-30 NOTE — Telephone Encounter (Signed)
Mychart message sent by pt: [facility] (supporting Martyn Ehrich, NP) 8 hours ago (6:50 AM)   DB Hello.  I had my at home sleep study the other week and it says in mychart there are results but when I click in it it doesn't show anything. Just curious if there are any and if I need to schedule an appt with anyone?   Thanks and have a great day!!    Beth, please advise.

## 2021-05-31 NOTE — Telephone Encounter (Signed)
We can refer him to dentist/ orthodontics (Dr. Augustina Mood) for loud snoring. He may benefit from oral appliance to help with snoring and cut down on some of the occasional apneas he is having. Please send in RX for atarax (hydroxyzine) take '10mg'$  at bedtime for sleep, we can increase dose to '20mg'$  if needed. This is an antihistamine that is prescription only and used for insomnia an anxiety at times

## 2021-07-03 DIAGNOSIS — R0683 Snoring: Secondary | ICD-10-CM

## 2021-07-07 ENCOUNTER — Ambulatory Visit: Payer: BLUE CROSS/BLUE SHIELD | Admitting: Family Medicine

## 2021-07-21 MED ORDER — HYDROXYZINE HCL 25 MG PO TABS: ORAL_TABLET | ORAL | 1 refills | Status: DC

## 2021-07-21 NOTE — Telephone Encounter (Signed)
Yes, he can take 25-'50mg'$  Hydroxyzine at bedtime for sleep

## 2021-07-21 NOTE — Telephone Encounter (Signed)
Please advise if ok to send in Hydroxyzine for this patient?

## 2021-07-26 ENCOUNTER — Ambulatory Visit: Payer: BLUE CROSS/BLUE SHIELD | Admitting: Family Medicine

## 2021-09-07 ENCOUNTER — Other Ambulatory Visit: Payer: Self-pay

## 2021-09-08 ENCOUNTER — Ambulatory Visit: Payer: BLUE CROSS/BLUE SHIELD | Attending: Cardiology | Admitting: Cardiology

## 2021-09-08 ENCOUNTER — Encounter: Payer: Self-pay | Admitting: Cardiology

## 2021-09-08 VITALS — BP 114/72 | HR 68 | Ht 71.0 in | Wt 222.1 lb

## 2021-09-08 DIAGNOSIS — R0989 Other specified symptoms and signs involving the circulatory and respiratory systems: Secondary | ICD-10-CM

## 2021-09-08 DIAGNOSIS — E782 Mixed hyperlipidemia: Secondary | ICD-10-CM

## 2021-09-08 DIAGNOSIS — E669 Obesity, unspecified: Secondary | ICD-10-CM | POA: Diagnosis not present

## 2021-09-08 HISTORY — DX: Other specified symptoms and signs involving the circulatory and respiratory systems: R09.89

## 2021-09-08 NOTE — Progress Notes (Signed)
Cardiology Office Note:    Date:  09/08/2021   ID:  BON DOWIS, DOB January 14, 1975, MRN [id number]  PCP:  Carollee Herter, Alferd Apa, DO  Cardiologist:  Jenean Lindau, MD   Referring MD: Carollee Herter, Alferd Apa, *    ASSESSMENT:    1. Mixed hyperlipidemia   2. Abdominal bruit   3. Obesity (BMI 30.0-34.9)    PLAN:    In order of problems listed above:  Primary prevention stressed with the patient.  Importance of compliance with diet and medications stressed and he vocalized understanding and questions were answered to satisfaction.   Mixed dyslipidemia: He has done well with diet.  He is trying to get up his exercise and do better and I congratulated him about this. Obesity: Weight reduction stressed and diet was emphasized and he promises to do better. Abdominal bruit: We will do an abdominal ultrasound to check for any vascular issues or abdominal aortic aneurysm.  He says he has family history of this. He will be seen in follow-up appointment on a as needed basis.  He had multiple questions which were answered to his satisfaction.   Medication Adjustments/Labs and Tests Ordered: Current medicines are reviewed at length with the patient today.  Concerns regarding medicines are outlined above.  Orders Placed This Encounter  Procedures   EKG 12-Lead   VAS [place] AAA DUPLEX   No orders of the defined types were placed in this encounter.    No chief complaint on file.    History of Present Illness:    Carl Lane is a 46 y.o. male.  Patient was evaluated for chest pain.  This has resolved.  His calcium score is 0.  He is taking good care of himself.  His lipids were elevated he changed his diet and has done an excellent job in taking care of this.  He is trying to lose weight.  He tells me that he has gained weight over the past couple of months.  No chest pain orthopnea or PND.  Past Medical History:  Diagnosis Date   Abdominal pain, right lower quadrant 08/21/2006    Qualifier: Diagnosis of  By: Cletus Gash MD, [place], UPPER 04/09/2009   Qualifier: Diagnosis of  By: Larose Kells MD, [place]    Anxiety    ANXIETY 03/12/2009   Qualifier: Diagnosis of  By: Linna Darner MD, Gwyndolyn Saxon     Backache 03/12/2009   Qualifier: Diagnosis of  By: Ronnald Ramp CMA, [place], HX OF 10/04/2007   Qualifier: Diagnosis of  By: Etter Sjogren DO, Yvonne     Biceps strain 01/26/2011   Chest pain 12/11/2017   Closed displaced fracture of proximal phalanx of lesser toe of right foot 01/20/2021   Gastroesophageal reflux disease without esophagitis 05/22/2006   Generalized anxiety disorder 03/12/2009   GERD 05/22/2006   Qualifier: Diagnosis of  By: Cletus Gash MD, Luis     GERD (gastroesophageal reflux disease)    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   Qualifier: Diagnosis of  By: Cletus Gash MD, [place]     Hyperlipidemia    HYPERLIPIDEMIA 03/18/2008   Qualifier: Diagnosis of  By: Jerold Coombe     Hyperlipidemia LDL goal <100 05/22/2017   Injury of left toe 07/16/2012   Insomnia 03/02/2021   Left arm pain 05/22/2017   Loud snoring 03/02/2021   Mixed hyperlipidemia 03/18/2008   Obesity (BMI 30.0-34.9) 06/09/2020   Obesity due to excess calories with serious  comorbidity 06/09/2020   OPEN WOUND FT NO TOE ALONE WITHOUT MENTION COMP 05/28/2009   Qualifier: Diagnosis of  By: Jerold Coombe     ORCHITIS/EPIDIDYMITIS NOS 08/21/2006   Qualifier: Diagnosis of  By: Cletus Gash MD, Excell Seltzer DISORDER 05/21/2006   Qualifier: Diagnosis of  By: Cletus Gash MD, [place]     Preventative health care 12/10/2017   Right elbow pain 11/14/2011   Right shoulder pain 10/26/2014   Vertigo 04/03/2016   Overview:   Formatting of this note might be different from the original.   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   FLUoxetine (PROZAC) 40 MG capsule Take 80 mg by mouth daily.     Allergies:   Patient has no known allergies.   Social History   Socioeconomic History   Marital status: Married    Spouse name: Not on file   Number of children: 2   Years of education: Not on file   Highest education level: Not on file  Occupational History   Occupation: Art gallery manager  Tobacco Use   Smoking status: Never   Smokeless tobacco: Never  Substance and Sexual Activity   Alcohol use: Yes    Alcohol/week: 0.0 standard drinks of alcohol    Comment: rarely   Drug use: No   Sexual activity: Not on file  Other Topics Concern   Not on file  Social History Narrative   Regular exercise- intermittent   Social Determinants of Health   Financial Resource Strain: Not on file  Food Insecurity: Not on file  Transportation Needs: Not on file  Physical Activity: Not on file  Stress: Not on file  Social Connections: Not on file     Family History: The patient's family history includes Atrial fibrillation in his mother; Heart attack in his cousin and maternal grandfather; Heart disease in his maternal uncle; Hyperlipidemia in his father; Hypertension in his father and another family member; Lupus in his sister; Other in his maternal aunt and sister. There is no history of Diabetes or Sudden death.  ROS:   Please see the history of present illness.    All other systems reviewed and are negative.  EKGs/Labs/Other Studies Reviewed:    The following studies were reviewed today: EKG reveals sinus rhythm and nonspecific ST-T changes   Recent Labs: No results found for requested labs within last 365 days.  Recent Lipid Panel    Component Value Date/Time   CHOL 171 06/03/2020 1004   TRIG 140.0 06/03/2020 1004   HDL 30.60 (L) 06/03/2020 1004   CHOLHDL 6 06/03/2020 1004   VLDL 28.0 06/03/2020 1004   LDLCALC  113 (H) 06/03/2020 1004   LDLDIRECT 114.0 01/23/2019 1544    Physical Exam:    VS:  BP 114/72   Pulse 68   Ht '5\' 11"'$  (1.803 m)   Wt 222 lb 1.3 oz (100.7 kg)   SpO2 97%   BMI 30.97 kg/m     Wt Readings from Last 3 Encounters:  09/08/21 222 lb 1.3 oz (100.7 kg)  03/02/21 234 lb 9.6 oz (106.4 kg)  01/20/21 233 lb (105.7 kg)  GEN: Patient is in no acute distress HEENT: Normal NECK: No JVD; No carotid bruits LYMPHATICS: No lymphadenopathy CARDIAC: Hear sounds regular, 2/6 systolic murmur at the apex. RESPIRATORY:  Clear to auscultation without rales, wheezing or rhonchi  ABDOMEN: Soft, non-tender, non-distended.  Abdominal bruit noted MUSCULOSKELETAL:  No edema; No deformity  SKIN: Warm and dry NEUROLOGIC:  Alert and oriented x 3 PSYCHIATRIC:  Normal affect   Signed, Jenean Lindau, MD  09/08/2021 3:27 PM    [facility]

## 2021-09-08 NOTE — Patient Instructions (Signed)
Medication Instructions:  Your physician recommends that you continue on your current medications as directed. Please refer to the Current Medication list given to you today.  *If you need a refill on your cardiac medications before your next appointment, please call your pharmacy*   Lab Work: None ordered If you have labs (blood work) drawn today and your tests are completely normal, you will receive your results only by: [place] (if you have MyChart) OR A paper copy in the mail If you have any lab test that is abnormal or we need to change your treatment, we will call you to review the results.   Testing/Procedures: Your physician has requested that you have an abdominal aorta duplex. During this test, an ultrasound is used to evaluate the aorta. Allow 30 minutes for this exam. Do not eat after midnight the day before and avoid carbonated beverages    Follow-Up: At [facility], you and your health needs are our priority.  As part of our continuing mission to provide you with exceptional heart care, we have created designated Provider Care Teams.  These Care Teams include your primary Cardiologist (physician) and Advanced Practice Providers (APPs -  Physician Assistants and Nurse Practitioners) who all work together to provide you with the care you need, when you need it.  We recommend signing up for the patient portal called "MyChart".  Sign up information is provided on this After Visit Summary.  MyChart is used to connect with patients for Virtual Visits (Telemedicine).  Patients are able to view lab/test results, encounter notes, upcoming appointments, etc.  Non-urgent messages can be sent to your provider as well.   To learn more about what you can do with MyChart, go to [email or web address].    Your next appointment:   As needed  The format for your next appointment:   In Person  Provider:   Jyl Heinz, MD   Other Instructions NA

## 2021-10-10 ENCOUNTER — Encounter: Payer: Self-pay | Admitting: Gastroenterology

## 2021-10-10 ENCOUNTER — Ambulatory Visit: Payer: BLUE CROSS/BLUE SHIELD | Admitting: Family Medicine

## 2021-10-10 ENCOUNTER — Encounter: Payer: Self-pay | Admitting: Family Medicine

## 2021-10-10 ENCOUNTER — Ambulatory Visit (HOSPITAL_BASED_OUTPATIENT_CLINIC_OR_DEPARTMENT_OTHER)
Admission: RE | Admit: 2021-10-10 | Discharge: 2021-10-10 | Disposition: A | Discharge status: Home / Self Care | Payer: BLUE CROSS/BLUE SHIELD | Source: Ambulatory Visit | Attending: Family Medicine | Admitting: Family Medicine

## 2021-10-10 VITALS — BP 124/80 | HR 81 | Temp 98.4°F | Resp 18 | Ht 71.0 in | Wt 222.6 lb

## 2021-10-10 DIAGNOSIS — K625 Hemorrhage of anus and rectum: Secondary | ICD-10-CM

## 2021-10-10 DIAGNOSIS — E785 Hyperlipidemia, unspecified: Secondary | ICD-10-CM

## 2021-10-10 DIAGNOSIS — Z125 Encounter for screening for malignant neoplasm of prostate: Secondary | ICD-10-CM

## 2021-10-10 DIAGNOSIS — E782 Mixed hyperlipidemia: Secondary | ICD-10-CM

## 2021-10-10 DIAGNOSIS — Z0000 Encounter for general adult medical examination without abnormal findings: Secondary | ICD-10-CM

## 2021-10-10 HISTORY — DX: Hemorrhage of anus and rectum: K62.5

## 2021-10-10 LAB — COMPREHENSIVE METABOLIC PANEL
ALT: 25 U/L (ref 0–53)
AST: 16 U/L (ref 0–37)
Albumin: 4.2 g/dL (ref 3.5–5.2)
Alkaline Phosphatase: 51 U/L (ref 39–117)
BUN: 17 mg/dL (ref 6–23)
CO2: 28 mEq/L (ref 19–32)
Calcium: 9.4 mg/dL (ref 8.4–10.5)
Chloride: 104 mEq/L (ref 96–112)
Creatinine, Ser: 1.1 mg/dL (ref 0.40–1.50)
GFR: 80.76 mL/min (ref >60.00)
Glucose, Bld: 100 mg/dL — ABNORMAL HIGH (ref 70–99)
Potassium: 4 mEq/L (ref 3.5–5.1)
Sodium: 139 mEq/L (ref 135–145)
Total Bilirubin: 1.2 mg/dL (ref 0.2–1.2)
Total Protein: 6.8 g/dL (ref 6.0–8.3)

## 2021-10-10 LAB — LDL CHOLESTEROL, DIRECT: Direct LDL: 134 mg/dL

## 2021-10-10 LAB — CBC WITH DIFFERENTIAL/PLATELET
Basophils Absolute: 0.1 10*3/uL (ref 0.0–0.1)
Basophils Relative: 0.7 % (ref 0.0–3.0)
Eosinophils Absolute: 0.2 10*3/uL (ref 0.0–0.7)
Eosinophils Relative: 3.2 % (ref 0.0–5.0)
HCT: 42.8 % (ref 39.0–52.0)
Hemoglobin: 14.4 g/dL (ref 13.0–17.0)
Lymphocytes Relative: 31.8 % (ref 12.0–46.0)
Lymphs Abs: 2.4 10*3/uL (ref 0.7–4.0)
MCHC: 33.8 g/dL (ref 30.0–36.0)
MCV: 83.9 fl (ref 78.0–100.0)
Monocytes Absolute: 0.5 10*3/uL (ref 0.1–1.0)
Monocytes Relative: 6.9 % (ref 3.0–12.0)
Neutro Abs: 4.4 10*3/uL (ref 1.4–7.7)
Neutrophils Relative %: 57.4 % (ref 43.0–77.0)
Platelets: 278 10*3/uL (ref 150.0–400.0)
RBC: 5.1 Mil/uL (ref 4.22–5.81)
RDW: 12.9 % (ref 11.5–15.5)
WBC: 7.6 10*3/uL (ref 4.0–10.5)

## 2021-10-10 LAB — PSA: PSA: 0.22 ng/mL (ref 0.10–4.00)

## 2021-10-10 LAB — LIPID PANEL
Cholesterol: 196 mg/dL (ref 0–200)
HDL: 29.3 mg/dL — ABNORMAL LOW (ref >39.00)
NonHDL: 166.56
Total CHOL/HDL Ratio: 7
Triglycerides: 233 mg/dL — ABNORMAL HIGH (ref 0.0–149.0)
VLDL: 46.6 mg/dL — ABNORMAL HIGH (ref 0.0–40.0)

## 2021-10-10 LAB — TSH: TSH: 1.9 u[IU]/mL (ref 0.35–5.50)

## 2021-10-10 NOTE — Assessment & Plan Note (Signed)
Encourage heart healthy diet such as MIND or DASH diet, increase exercise, avoid trans fats, simple carbohydrates and processed foods, consider a krill or fish or flaxseed oil cap daily.  °

## 2021-10-10 NOTE — Assessment & Plan Note (Signed)
Refer to GI Check labs and xray

## 2021-10-10 NOTE — Assessment & Plan Note (Signed)
Pt coming back in a few weeks for cpe Will get labs today

## 2021-10-10 NOTE — Progress Notes (Signed)
Subjective:   By signing my name below, I, Carylon Perches, attest that this documentation has been prepared under the direction and in the presence of Ann Held DO 10/10/2021    Patient ID: Carl Lane, male    DOB: 12/26/1975, 46 y.o.   MRN: [id number]  Chief Complaint  Patient presents with   Blood In Stools    Pt states sxs started Sunday. Pt states blood in the toilet and with wiping. No pain or cramping.     HPI Patient is in today for an office visit.  Patient complains of blood in his stool and has experienced this since Wednesday. He also notes of clots. He denies of any abdominal pain, straining or hemorrhoid pain.   Past Medical History:  Diagnosis Date   Abdominal pain, right lower quadrant 08/21/2006   Qualifier: Diagnosis of  By: Cletus Gash MD, [place], UPPER 04/09/2009   Qualifier: Diagnosis of  By: Larose Kells MD, [place]    Anxiety    ANXIETY 03/12/2009   Qualifier: Diagnosis of  By: Linna Darner MD, Gwyndolyn Saxon     Backache 03/12/2009   Qualifier: Diagnosis of  By: Ronnald Ramp CMA, [place], HX OF 10/04/2007   Qualifier: Diagnosis of  By: Etter Sjogren DO, Jenessa [place]     Biceps strain 01/26/2011   Chest pain 12/11/2017   Closed displaced fracture of proximal phalanx of lesser toe of right foot 01/20/2021   Gastroesophageal reflux disease without esophagitis 05/22/2006   Generalized anxiety disorder 03/12/2009   GERD 05/22/2006   Qualifier: Diagnosis of  By: Cletus Gash MD, [place]     GERD (gastroesophageal reflux disease)    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   Qualifier: Diagnosis of  By: Cletus Gash MD, [place]     Hyperlipidemia    HYPERLIPIDEMIA 03/18/2008   Qualifier: Diagnosis of  By: Jerold Coombe     Hyperlipidemia LDL goal <100 05/22/2017   Injury of left toe 07/16/2012   Insomnia 03/02/2021   Left arm pain 05/22/2017   Loud snoring 03/02/2021   Mixed hyperlipidemia 03/18/2008   Obesity (BMI 30.0-34.9) 06/09/2020   Obesity due to excess calories with  serious comorbidity 06/09/2020   OPEN WOUND FT NO TOE ALONE WITHOUT MENTION COMP 05/28/2009   Qualifier: Diagnosis of  By: Jerold Coombe     ORCHITIS/EPIDIDYMITIS NOS 08/21/2006   Qualifier: Diagnosis of  By: Cletus Gash MD, Excell Seltzer DISORDER 05/21/2006   Qualifier: Diagnosis of  By: Cletus Gash MD, [place]     Preventative health care 12/10/2017   Right elbow pain 11/14/2011   Right shoulder pain 10/26/2014   Vertigo 04/03/2016   Overview:   Formatting of this note might be different from the original.   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; Frenulum surgery as child    Family History  Problem Relation Age of Onset   Hypertension Other    Hypertension Father    Hyperlipidemia Father    Lupus Sister    Other Sister        bicuspid aortic disease and had it replaced at 34   Atrial fibrillation Mother        treated with medication   Heart attack Maternal Grandfather        died at 83   Other Maternal Aunt  bicuspbid aortic disease and had it replaced at 29   Heart attack Cousin        maternal cousin   Heart disease Maternal Uncle        quadrupal bypass   Diabetes Neg Hx    Sudden death Neg Hx     Social History   Socioeconomic History   Marital status: Married    Spouse name: Not on file   Number of children: 2   Years of education: Not on file   Highest education level: Not on file  Occupational History   Occupation: Art gallery manager  Tobacco Use   Smoking status: Never   Smokeless tobacco: Never  Substance and Sexual Activity   Alcohol use: Yes    Alcohol/week: 0.0 standard drinks of alcohol    Comment: rarely   Drug use: No   Sexual activity: Not on file  Other Topics Concern   Not on file  Social History Narrative   Regular exercise- intermittent   Social Determinants of Health   Financial Resource Strain: Not on file  Food  Insecurity: Not on file  Transportation Needs: Not on file  Physical Activity: Not on file  Stress: Not on file  Social Connections: Not on file  Intimate Partner Violence: Not on file    Outpatient Medications Prior to Visit  Medication Sig Dispense Refill   ALPRAZolam (XANAX) 1 MG tablet Take 1 mg by mouth 3 (three) times daily as needed for anxiety.     FLUoxetine (PROZAC) 40 MG capsule Take 80 mg by mouth daily.     hydrOXYzine (ATARAX) 25 MG tablet Take 25-50 mg at bedtime for sleep. (Patient not taking: Reported on 09/08/2021) 90 tablet 1   No facility-administered medications prior to visit.    No Known Allergies  Review of Systems  Constitutional:  Negative for fever and malaise/fatigue.  HENT:  Negative for congestion.   Eyes:  Negative for blurred vision.  Respiratory:  Negative for shortness of breath.   Cardiovascular:  Negative for chest pain, palpitations and leg swelling.  Gastrointestinal:  Positive for blood in stool. Negative for abdominal pain and nausea.       (-) straining  (-) hemorrhoid pain  Genitourinary:  Negative for dysuria and frequency.  Musculoskeletal:  Negative for falls.  Skin:  Negative for rash.  Neurological:  Negative for dizziness, loss of consciousness and headaches.  Endo/Heme/Allergies:  Negative for environmental allergies.  Psychiatric/Behavioral:  Negative for depression. The patient is not nervous/anxious.        Objective:    Physical Exam Vitals and nursing note reviewed.  Constitutional:      General: He is not in acute distress.    Appearance: Normal appearance. He is not ill-appearing.  HENT:     Head: Normocephalic and atraumatic.     Right Ear: External ear normal.     Left Ear: External ear normal.  Eyes:     Extraocular Movements: Extraocular movements intact.     Pupils: Pupils are equal, round, and reactive to light.  Cardiovascular:     Rate and Rhythm: Normal rate and regular rhythm.     Heart sounds:  Normal heart sounds. No murmur heard.    No gallop.  Pulmonary:     Effort: Pulmonary effort is normal. No respiratory distress.     Breath sounds: Normal breath sounds. No wheezing or rales.  Abdominal:     General: There is no distension.     Tenderness: There is  no abdominal tenderness. There is no guarding or rebound.  Genitourinary:    Rectum: Guaiac result positive. No mass, tenderness, anal fissure, external hemorrhoid or internal hemorrhoid. Normal anal tone.  Skin:    General: Skin is warm and dry.  Neurological:     Mental Status: He is alert.  Psychiatric:        Judgment: Judgment normal.     BP 124/80 (BP Location: Right Arm, Patient Position: Sitting, Cuff Size: Normal)   Pulse 81   Temp 98.4 F (36.9 C) (Oral)   Resp 18   Ht '5\' 11"'$  (1.803 m)   Wt 222 lb 9.6 oz (101 kg)   SpO2 99%   BMI 31.05 kg/m  Wt Readings from Last 3 Encounters:  10/10/21 222 lb 9.6 oz (101 kg)  09/08/21 222 lb 1.3 oz (100.7 kg)  03/02/21 234 lb 9.6 oz (106.4 kg)    Diabetic Foot Exam - Simple   No data filed    Lab Results  Component Value Date   WBC 6.6 06/03/2020   HGB 14.6 06/03/2020   HCT 43.1 06/03/2020   PLT 247.0 06/03/2020   GLUCOSE 87 06/03/2020   CHOL 171 06/03/2020   TRIG 140.0 06/03/2020   HDL 30.60 (L) 06/03/2020   LDLDIRECT 114.0 01/23/2019   LDLCALC 113 (H) 06/03/2020   ALT 24 06/03/2020   AST 19 06/03/2020   NA 138 06/03/2020   K 4.6 06/03/2020   CL 104 06/03/2020   CREATININE 1.09 06/03/2020   BUN 17 06/03/2020   CO2 29 06/03/2020   TSH 1.30 06/03/2020   PSA 0.29 06/03/2020   MICROALBUR 0.9 01/23/2019    Lab Results  Component Value Date   TSH 1.30 06/03/2020   Lab Results  Component Value Date   WBC 6.6 06/03/2020   HGB 14.6 06/03/2020   HCT 43.1 06/03/2020   MCV 84.3 06/03/2020   PLT 247.0 06/03/2020   Lab Results  Component Value Date   NA 138 06/03/2020   K 4.6 06/03/2020   CO2 29 06/03/2020   GLUCOSE 87 06/03/2020   BUN 17  06/03/2020   CREATININE 1.09 06/03/2020   BILITOT 1.8 (H) 06/03/2020   ALKPHOS 44 06/03/2020   AST 19 06/03/2020   ALT 24 06/03/2020   PROT 6.8 06/03/2020   ALBUMIN 4.3 06/03/2020   CALCIUM 9.8 06/03/2020   GFR 82.43 06/03/2020   Lab Results  Component Value Date   CHOL 171 06/03/2020   Lab Results  Component Value Date   HDL 30.60 (L) 06/03/2020   Lab Results  Component Value Date   LDLCALC 113 (H) 06/03/2020   Lab Results  Component Value Date   TRIG 140.0 06/03/2020   Lab Results  Component Value Date   CHOLHDL 6 06/03/2020   No results found for: "HGBA1C"     Assessment & Plan:   Problem List Items Addressed This Visit       Unprioritized   Hyperlipidemia   Relevant Orders   Lipid panel   Lipoprotein A (LPA)   Rectal bleeding - Primary    Refer to GI Check labs and xray       Relevant Orders   Ambulatory referral to Gastroenterology   CBC with Differential/Platelet   Comprehensive metabolic panel   DG Abd 2 Views (Completed)   Preventative health care    Pt coming back in a few weeks for cpe Will get labs today      Relevant Orders   Lipid panel  Lipoprotein A (LPA)   PSA   TSH   Mixed hyperlipidemia    Encourage heart healthy diet such as MIND or DASH diet, increase exercise, avoid trans fats, simple carbohydrates and processed foods, consider a krill or fish or flaxseed oil cap daily.       No orders of the defined types were placed in this encounter.   IAnn Held, DO, personally preformed the services described in this documentation.  All medical record entries made by the scribe were at my direction and in my presence.  I have reviewed the chart and discharge instructions (if applicable) and agree that the record reflects my personal performance and is accurate and complete. 10/10/2021   I,Amber Collins,acting as a scribe for Ann Held, DO.,have documented all relevant documentation on the behalf of Ann Held, DO,as directed by  Ann Held, DO while in the presence of Ann Held, DO.      Ann Held, DO

## 2021-10-10 NOTE — Patient Instructions (Signed)
Rectal Bleeding  Rectal bleeding is when blood passes out of the opening between the buttocks (anus). People with rectal bleeding may notice bright red blood in their underwear or in the toilet after having a bowel movement. They may also have blood mixed with their stool (feces), or dark red or black stools. Rectal bleeding is usually a sign that something is wrong. Many things can cause rectal bleeding, including: Diverticulosis. This is a condition in which pockets or sacs project from the bowel. Hemorrhoids. These are blood vessels around the anus or inside the rectum that are larger than normal. Anal fissures. This is a tear in the anus. Proctitis and colitis. These are conditions in which the rectum, colon, or anus become inflamed. Polyps. These are growths that can be cancerous (malignant) or noncancerous (benign). Infections of the intestines. Fistulas. These are abnormal openings in the rectum and anus. Rectal prolapse. This is when a part of the rectum sticks out from the anus. Follow these instructions at home: Pay attention to any changes in your symptoms. Take these actions to help reduce bleeding and discomfort: Medicines Take over-the-counter and prescription medicines only as told by your health care provider. Ask your health care provider about changing or stopping your regular medicines or supplements. This is especially important if you are taking blood thinners. Medicines that thin the blood can make rectal bleeding worse. Managing constipation Your condition may cause constipation. To prevent or treat constipation, or to help make your stools soft, you may need to: Drink enough fluid to keep your urine pale yellow. Take over-the-counter or prescription medicines. Eat foods that are high in fiber, such as beans, whole grains, and fresh fruits and vegetables. Ask your health care provider if you need a supplement to give you more fiber. Limit foods that are high in fat and  processed sugars, such as fried or sweet foods.  General instructions Try not to strain when having a bowel movement. Try taking a warm bath. This may help to soothe any pain in your rectum. Keep all follow-up visits as told by your health care provider. This is important. Contact a health care provider if you: Have pain or tenderness in your abdomen. Have a fever. Have weakness. Have nausea. Cannot have a bowel movement. Get help right away if you have: New or increased rectal bleeding. Black or dark red stools. Vomit with blood or something that looks like coffee grounds. A fainting episode. Severe pain in your rectum. Summary Rectal bleeding is usually a sign that something is wrong. This condition should be evaluated by a health care provider. Eat a diet that is high in fiber. This will help keep your stools soft, making it easier to pass stools without straining. Medicines that thin the blood can make rectal bleeding worse. Get help right away if you have new or increased rectal bleeding, black or dark red stools, blood in your vomit, an episode of fainting, or severe pain in your rectum. This information is not intended to replace advice given to you by your health care provider. Make sure you discuss any questions you have with your health care provider. Document Revised: 11/27/2018 Document Reviewed: 11/27/2018 Elsevier Patient Education  2023 Elsevier Inc.  

## 2021-10-15 LAB — LIPOPROTEIN A (LPA): Lipoprotein (a): 67 nmol/L (ref <75)

## 2021-10-25 ENCOUNTER — Ambulatory Visit (INDEPENDENT_AMBULATORY_CARE_PROVIDER_SITE_OTHER): Payer: BLUE CROSS/BLUE SHIELD | Admitting: Family Medicine

## 2021-10-25 VITALS — BP 118/70 | HR 78 | Temp 98.6°F | Resp 18 | Ht 71.0 in | Wt 226.0 lb

## 2021-10-25 DIAGNOSIS — Z0000 Encounter for general adult medical examination without abnormal findings: Secondary | ICD-10-CM

## 2021-10-25 NOTE — Patient Instructions (Signed)

## 2021-10-25 NOTE — Progress Notes (Signed)
Subjective:   By signing my name below, I, [name], attest that this documentation has been prepared under the direction and in the presence of Ann Held, DO. 10/25/2021     Patient ID: Carl Lane, male    DOB: [birth year], 46 y.o.   MRN: [id number]  Chief Complaint  Patient presents with  . Annual Exam    Pt states not fasting     HPI Patient is in today for a comprehensive physical exam.   He is requesting a form be filled to participate in his sons eagle scout activities. He does not have history of diabetes or scuba diving.  He denies having any fever, new muscle pain, new joint pain, new moles, congestion, sinus pain, sore throat, chest pain, palpations, cough, SOB, wheezing, n/v/d, constipation, blood in stool, dysuria, frequency, hematuria, or headaches at this time. He is not interested in receiving the flu vaccine. He has 4 pfizer Covid-19 vaccines.  He is UTD on vision care. He is 20/20 vision with his glasses.    Past Medical History:  Diagnosis Date  . Abdominal pain, right lower quadrant 08/21/2006   Qualifier: Diagnosis of  By: Cletus Gash MD, [place]    . ABDOMINAL PAIN, UPPER 04/09/2009   Qualifier: Diagnosis of  By: Larose Kells MD, [place] Anxiety   . ANXIETY 03/12/2009   Qualifier: Diagnosis of  By: Linna Darner MD, Gwyndolyn Saxon    . Backache 03/12/2009   Qualifier: Diagnosis of  By: Ronnald Ramp CMA, [place]    . BENIGN POSITIONAL VERTIGO, HX OF 10/04/2007   Qualifier: Diagnosis of  By: Jerold Coombe    . Biceps strain 01/26/2011  . Chest pain 12/11/2017  . Closed displaced fracture of proximal phalanx of lesser toe of right foot 01/20/2021  . Gastroesophageal reflux disease without esophagitis 05/22/2006  . Generalized anxiety disorder 03/12/2009  . GERD 05/22/2006   Qualifier: Diagnosis of  By: Cletus Gash MD, [place]    . GERD (gastroesophageal reflux disease)   . 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 Hyperlipidemia   .  HYPERLIPIDEMIA 03/18/2008   Qualifier: Diagnosis of  By: Jerold Coombe    . Hyperlipidemia LDL goal <100 05/22/2017  . Injury of left toe 07/16/2012  . Insomnia 03/02/2021  . Left arm pain 05/22/2017  . Loud snoring 03/02/2021  . Mixed hyperlipidemia 03/18/2008  . Obesity (BMI 30.0-34.9) 06/09/2020  . Obesity due to excess calories with serious comorbidity 06/09/2020  . OPEN WOUND FT NO TOE ALONE WITHOUT MENTION COMP 05/28/2009   Qualifier: Diagnosis of  By: Jerold Coombe    . ORCHITIS/EPIDIDYMITIS NOS 08/21/2006   Qualifier: Diagnosis of  By: Cletus Gash MD, [place]    . PANIC DISORDER 05/21/2006   Qualifier: Diagnosis of  By: Cletus Gash MD, [place]    . Preventative health care 12/10/2017  . Right elbow pain 11/14/2011  . Right shoulder pain 10/26/2014  . Vertigo 04/03/2016   Overview:   Formatting of this note might be different from the original.  . Viral syndrome 05/24/2010    Past Surgical History:  Procedure Laterality Date  . 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 Hypertension Other   . Hypertension Father   . Hyperlipidemia Father   . Lupus Sister   . Other Sister  bicuspid aortic disease and had it replaced at 14  . Atrial fibrillation Mother        treated with medication  . Heart attack Maternal Grandfather        died at 75  . Other Maternal Aunt        bicuspbid aortic disease and had it replaced at 44  . Heart attack Cousin        maternal cousin  . Heart disease Maternal Uncle        quadrupal bypass  . Diabetes Neg Hx   . Sudden death Neg Hx     Social History   Socioeconomic History  . Marital status: Married    Spouse name: Not on file  . Number of children: 2  . Years of education: Not on file  . Highest education level: Not on file  Occupational History  . Occupation: Art gallery manager  Tobacco Use  . Smoking status: Never  . Smokeless  tobacco: Never  Substance and Sexual Activity  . Alcohol use: Yes    Alcohol/week: 0.0 standard drinks of alcohol    Comment: rarely  . Drug use: No  . Sexual activity: Not on file  Other Topics Concern  . Not on file  Social History Narrative   Regular exercise- intermittent   Social Determinants of Health   Financial Resource Strain: Not on file  Food Insecurity: Not on file  Transportation Needs: Not on file  Physical Activity: Not on file  Stress: Not on file  Social Connections: Not on file  Intimate Partner Violence: Not on file    Outpatient Medications Prior to Visit  Medication Sig Dispense Refill  . ALPRAZolam (XANAX) 1 MG tablet Take 1 mg by mouth 3 (three) times daily as needed for anxiety.    Marland Kitchen FLUoxetine (PROZAC) 40 MG capsule Take 80 mg by mouth daily.     No facility-administered medications prior to visit.    No Known Allergies  Review of Systems  Constitutional:  Negative for fever and malaise/fatigue.  HENT:  Negative for congestion, sinus pain and sore throat.   Eyes:  Negative for blurred vision.  Respiratory:  Negative for cough, shortness of breath and wheezing.   Cardiovascular:  Negative for chest pain, palpitations and leg swelling.  Gastrointestinal:  Negative for abdominal pain, blood in stool, constipation, diarrhea, nausea and vomiting.  Genitourinary:  Negative for dysuria, frequency and hematuria.  Musculoskeletal:  Negative for falls.       (-)new muscle pain (-)new joint pain  Skin:  Negative for rash.       (-)New moles  Neurological:  Negative for dizziness, loss of consciousness and headaches.  Endo/Heme/Allergies:  Negative for environmental allergies.  Psychiatric/Behavioral:  Negative for depression. The patient is not nervous/anxious.        Objective:    Physical Exam Vitals and nursing note reviewed.  Constitutional:      General: He is not in acute distress.    Appearance: Normal appearance. He is well-developed. He  is not ill-appearing or diaphoretic.  HENT:     Head: Normocephalic and atraumatic.     Right Ear: Tympanic membrane, ear canal and external ear normal.     Left Ear: Tympanic membrane, ear canal and external ear normal.     Nose: Nose normal.     Mouth/Throat:     Pharynx: No oropharyngeal exudate.  Eyes:     General:        Right eye: No discharge.  Left eye: No discharge.     Extraocular Movements: Extraocular movements intact.     Conjunctiva/sclera: Conjunctivae normal.     Pupils: Pupils are equal, round, and reactive to light.  Neck:     Thyroid: No thyromegaly.     Vascular: No JVD.  Cardiovascular:     Rate and Rhythm: Normal rate and regular rhythm.     Heart sounds: Normal heart sounds. No murmur heard.    No gallop.  Pulmonary:     Effort: Pulmonary effort is normal. No respiratory distress.     Breath sounds: Normal breath sounds. No wheezing or rales.  Chest:     Chest wall: No tenderness.  Abdominal:     General: Bowel sounds are normal. There is no distension.     Palpations: Abdomen is soft. There is no mass.     Tenderness: There is no abdominal tenderness. There is no guarding or rebound.  Musculoskeletal:        General: No tenderness. Normal range of motion.     Cervical back: Normal range of motion and neck supple.  Lymphadenopathy:     Cervical: No cervical adenopathy.  Skin:    General: Skin is warm and dry.     Findings: No erythema or rash.  Neurological:     Mental Status: He is alert and oriented to person, place, and time.     Cranial Nerves: No cranial nerve deficit.     Motor: No abnormal muscle tone.     Deep Tendon Reflexes: Reflexes are normal and symmetric. Reflexes normal.  Psychiatric:        Behavior: Behavior normal.        Thought Content: Thought content normal.        Judgment: Judgment normal.    There were no vitals taken for this visit. Wt Readings from Last 3 Encounters:  10/10/21 222 lb 9.6 oz (101 kg)  09/08/21  222 lb 1.3 oz (100.7 kg)  03/02/21 234 lb 9.6 oz (106.4 kg)    Diabetic Foot Exam - Simple   No data filed    Lab Results  Component Value Date   WBC 7.6 10/10/2021   HGB 14.4 10/10/2021   HCT 42.8 10/10/2021   PLT 278.0 10/10/2021   GLUCOSE 100 (H) 10/10/2021   CHOL 196 10/10/2021   TRIG 233.0 (H) 10/10/2021   HDL 29.30 (L) 10/10/2021   LDLDIRECT 134.0 10/10/2021   LDLCALC 113 (H) 06/03/2020   ALT 25 10/10/2021   AST 16 10/10/2021   NA 139 10/10/2021   K 4.0 10/10/2021   CL 104 10/10/2021   CREATININE 1.10 10/10/2021   BUN 17 10/10/2021   CO2 28 10/10/2021   TSH 1.90 10/10/2021   PSA 0.22 10/10/2021   MICROALBUR 0.9 01/23/2019    Lab Results  Component Value Date   TSH 1.90 10/10/2021   Lab Results  Component Value Date   WBC 7.6 10/10/2021   HGB 14.4 10/10/2021   HCT 42.8 10/10/2021   MCV 83.9 10/10/2021   PLT 278.0 10/10/2021   Lab Results  Component Value Date   NA 139 10/10/2021   K 4.0 10/10/2021   CO2 28 10/10/2021   GLUCOSE 100 (H) 10/10/2021   BUN 17 10/10/2021   CREATININE 1.10 10/10/2021   BILITOT 1.2 10/10/2021   ALKPHOS 51 10/10/2021   AST 16 10/10/2021   ALT 25 10/10/2021   PROT 6.8 10/10/2021   ALBUMIN 4.2 10/10/2021   CALCIUM 9.4 10/10/2021  GFR 80.76 10/10/2021   Lab Results  Component Value Date   CHOL 196 10/10/2021   Lab Results  Component Value Date   HDL 29.30 (L) 10/10/2021   Lab Results  Component Value Date   LDLCALC 113 (H) 06/03/2020   Lab Results  Component Value Date   TRIG 233.0 (H) 10/10/2021   Lab Results  Component Value Date   CHOLHDL 7 10/10/2021   No results found for: "HGBA1C"  Colonoscopy- Not yet completed.  PSA- Last completed 10/10/2021. Results showed 0.22.      Assessment & Plan:   Problem List Items Addressed This Visit   None    No orders of the defined types were placed in this encounter.   I, Shehryar [place], personally preformed the services described in this documentation.   All medical record entries made by the scribe were at my direction and in my presence.  I have reviewed the chart and discharge instructions (if applicable) and agree that the record reflects my personal performance and is accurate and complete. 10/25/2021   I,[name],acting as a scribe for Ann Held, DO.,have documented all relevant documentation on the behalf of Ann Held, DO,as directed by  Ann Held, DO while in the presence of Ann Held, DO.   Shehryar [employer]

## 2021-10-26 ENCOUNTER — Encounter: Payer: Self-pay | Admitting: Family Medicine

## 2021-10-26 NOTE — Assessment & Plan Note (Signed)
ghm utd Check labs  See avs  

## 2021-11-16 ENCOUNTER — Ambulatory Visit: Payer: BLUE CROSS/BLUE SHIELD | Admitting: Gastroenterology

## 2021-11-16 ENCOUNTER — Encounter: Payer: Self-pay | Admitting: Gastroenterology

## 2021-11-16 VITALS — BP 120/80 | HR 72 | Ht 70.0 in | Wt 232.0 lb

## 2021-11-16 DIAGNOSIS — K921 Melena: Secondary | ICD-10-CM

## 2021-11-16 MED ORDER — SUTAB 1479-225-188 MG PO TABS: 1.0000 | ORAL_TABLET | Freq: Once | ORAL | 0 refills | Status: AC

## 2021-11-16 NOTE — Patient Instructions (Signed)
_______________________________________________________  If you are age 46 or older, your body mass index should be between 23-30. Your Body mass index is 33.29 kg/m. If this is out of the aforementioned range listed, please consider follow up with your Primary Care Provider.  If you are age 31 or younger, your body mass index should be between 19-25. Your Body mass index is 33.29 kg/m. If this is out of the aformentioned range listed, please consider follow up with your Primary Care Provider.   You have been scheduled for a colonoscopy. Please follow written instructions given to you at your visit today.  Please pick up your prep supplies at the pharmacy within the next 1-3 days. If you use inhalers (even only as needed), please bring them with you on the day of your procedure.  The [place] [facility] providers would like to encourage you to use [facility] to communicate with providers for non-urgent requests or questions.  Due to long hold times on the telephone, sending your provider a message by [facility] may be a faster and more efficient way to get a response.  Please allow 48 business hours for a response.  Please remember that this is for non-urgent requests.   It was a pleasure to see you today!  Thank you for trusting me with your gastrointestinal care!    Scott E.Candis Schatz, MD

## 2021-11-16 NOTE — Progress Notes (Unsigned)
HPI :     Had BRB per rectum for about a week.  No other GI symptoms Red blood with clots, no blood.  Twice one day Two more days, less blood.  Went away for a day, then returned for 2 days.   Hasn't seen any blood for 2 weeks. No abdominal pain.  No pain with passage of stool Has prolapsed hemorrhoids for the past several years, reduce eventually on their own.  No bleeding until last month No previous  Regular bowel movements now.   Constipated as a kid No diarrhea.   Weight fluctuating recently.  No fam hx GI malignancy     Past Medical History:  Diagnosis Date   Abdominal pain, right lower quadrant 08/21/2006   Qualifier: Diagnosis of  By: Cletus Gash MD, [place], UPPER 04/09/2009   Qualifier: Diagnosis of  By: Larose Kells MD, [place]    Anxiety    ANXIETY 03/12/2009   Qualifier: Diagnosis of  By: Linna Darner MD, [place]wyndolyn [place]     Arthritis    Backache 03/12/2009   Qualifier: Diagnosis of  By: Ronnald Ramp CMA, [place]     BENIGN POSITIONAL VERTIGO, HX OF 10/04/2007   Qualifier: Diagnosis of  By: Etter Sjogren DO, [place]     Biceps strain 01/26/2011   Chest pain 12/11/2017   Closed displaced fracture of proximal phalanx of lesser toe of right foot 01/20/2021   Gastroesophageal reflux disease without esophagitis 05/22/2006   Generalized anxiety disorder 03/12/2009   GERD 05/22/2006   Qualifier: Diagnosis of  By: Cletus Gash MD, [place]     GERD (gastroesophageal reflux disease)    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   Qualifier: Diagnosis of  By: Cletus Gash MD, [place]     Hyperlipidemia    HYPERLIPIDEMIA 03/18/2008   Qualifier: Diagnosis of  By: Jerold Coombe     Hyperlipidemia LDL goal <100 05/22/2017   Injury of left toe 07/16/2012   Insomnia 03/02/2021   Left arm pain 05/22/2017   Loud snoring 03/02/2021   Mixed hyperlipidemia 03/18/2008   Obesity (BMI 30.0-34.9) 06/09/2020   Obesity due to excess calories with serious comorbidity 06/09/2020   OPEN WOUND FT NO TOE ALONE WITHOUT  MENTION COMP 05/28/2009   Qualifier: Diagnosis of  By: Jerold Coombe     ORCHITIS/EPIDIDYMITIS NOS 08/21/2006   Qualifier: Diagnosis of  By: Cletus Gash MD, Excell Seltzer DISORDER 05/21/2006   Qualifier: Diagnosis of  By: Cletus Gash MD, [place]     Pneumonia    Preventative health care 12/10/2017   Right elbow pain 11/14/2011   Right shoulder pain 10/26/2014   Vertigo 04/03/2016   Overview:   Formatting of this note might be different from the original.   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; Frenulum surgery as child   Family History  Problem Relation Age of Onset   Atrial fibrillation Mother        treated with medication   Diabetes Mother    Emphysema Mother    COPD Mother    Hypertension Father    Hyperlipidemia Father    Heart disease Father    Lupus Sister    Other Sister        bicuspid aortic disease and had it replaced at 33   Ovarian cancer Sister    Heart attack Maternal Grandfather  died at 8   Lung cancer Paternal Grandmother    Heart disease Paternal Grandfather    Diabetes Paternal Grandfather    Other Maternal Aunt        bicuspbid aortic disease and had it replaced at 4   Heart disease Maternal Uncle        quadrupal bypass   Heart attack Cousin        maternal cousin   Sudden death Neg Hx    Social History   Tobacco Use   Smoking status: Never   Smokeless tobacco: Never  Vaping Use   Vaping Use: Never used  Substance Use Topics   Alcohol use: Yes    Comment: once a month   Drug use: No   Current Outpatient Medications  Medication Sig Dispense Refill   ALPRAZolam (XANAX) 1 MG tablet Take 1 mg by mouth 3 (three) times daily as needed for anxiety.     FLUoxetine (PROZAC) 40 MG capsule Take 80 mg by mouth daily.     No current facility-administered medications for this visit.   No Known Allergies   Review of  Systems: All systems reviewed and negative except where noted in HPI.    No results found.  Physical Exam: BP 120/80 (BP Location: Left Arm, Patient Position: Sitting, Cuff Size: Normal)   Pulse 72   Ht 5' 10" (1.778 m)   Wt 232 lb (105.2 kg)   BMI 33.29 kg/m  Constitutional: Pleasant,well-developed, ***male in no acute distress. HEENT: Normocephalic and atraumatic. Conjunctivae are normal. No scleral icterus. Neck supple.  Cardiovascular: Normal rate, regular rhythm.  Pulmonary/chest: Effort normal and breath sounds normal. No wheezing, rales or rhonchi. Abdominal: Soft, nondistended, nontender. Bowel sounds active throughout. There are no masses palpable. No hepatomegaly. Extremities: no edema Lymphadenopathy: No cervical adenopathy noted. Neurological: Alert and oriented to person place and time. Skin: Skin is warm and dry. No rashes noted. Psychiatric: Normal mood and affect. Behavior is normal.  CBC    Component Value Date/Time   WBC 7.6 10/10/2021 1023   RBC 5.10 10/10/2021 1023   HGB 14.4 10/10/2021 1023   HCT 42.8 10/10/2021 1023   PLT 278.0 10/10/2021 1023   MCV 83.9 10/10/2021 1023   MCHC 33.8 10/10/2021 1023   RDW 12.9 10/10/2021 1023   LYMPHSABS 2.4 10/10/2021 1023   MONOABS 0.5 10/10/2021 1023   EOSABS 0.2 10/10/2021 1023   BASOSABS 0.1 10/10/2021 1023    CMP     Component Value Date/Time   NA 139 10/10/2021 1023   K 4.0 10/10/2021 1023   CL 104 10/10/2021 1023   CO2 28 10/10/2021 1023   GLUCOSE 100 (H) 10/10/2021 1023   BUN 17 10/10/2021 1023   CREATININE 1.10 10/10/2021 1023   CALCIUM 9.4 10/10/2021 1023   PROT 6.8 10/10/2021 1023   ALBUMIN 4.2 10/10/2021 1023   AST 16 10/10/2021 1023   ALT 25 10/10/2021 1023   ALKPHOS 51 10/10/2021 1023   BILITOT 1.2 10/10/2021 1023   GFRNONAA >60 04/09/2009 1522   GFRAA  04/09/2009 1522    >60        The eGFR has been calculated using the MDRD equation. This calculation has not been validated in all  clinical situations. eGFR's persistently <60 mL/min signify possible Chronic Kidney Disease.     ASSESSMENT AND PLAN:   Presumed hemorrhoids, has chronic prolapse symptoms but no bleeding until recent  Hematochezia - Colonoscopy   Ann Held, *

## 2021-11-28 ENCOUNTER — Encounter: Payer: Self-pay | Admitting: Gastroenterology

## 2021-11-28 ENCOUNTER — Ambulatory Visit (AMBULATORY_SURGERY_CENTER): Payer: BLUE CROSS/BLUE SHIELD | Admitting: Gastroenterology

## 2021-11-28 VITALS — BP 101/65 | HR 63 | Temp 97.8°F | Resp 12 | Ht 70.0 in | Wt 232.0 lb

## 2021-11-28 DIAGNOSIS — K649 Unspecified hemorrhoids: Secondary | ICD-10-CM | POA: Diagnosis not present

## 2021-11-28 DIAGNOSIS — K5730 Diverticulosis of large intestine without perforation or abscess without bleeding: Secondary | ICD-10-CM | POA: Diagnosis not present

## 2021-11-28 DIAGNOSIS — D123 Benign neoplasm of transverse colon: Secondary | ICD-10-CM

## 2021-11-28 DIAGNOSIS — K921 Melena: Secondary | ICD-10-CM | POA: Diagnosis present

## 2021-11-28 MED ORDER — SODIUM CHLORIDE 0.9 % IV SOLN: 500.0000 mL | Freq: Once | INTRAVENOUS | Status: DC

## 2021-11-28 NOTE — Progress Notes (Unsigned)
Report to PACU, RN, vss, BBS= Clear.  

## 2021-11-28 NOTE — Progress Notes (Unsigned)
History and Physical Interval Note:  11/28/2021 3:55 PM  Carl Lane  has presented today for endoscopic procedure(s), with the diagnosis of  Encounter Diagnosis  Name Primary?   Hematochezia Yes  .  The various methods of evaluation and treatment have been discussed with the patient and/or family. After consideration of risks, benefits and other options for treatment, the patient has consented to  the endoscopic procedure(s).   The patient's history has been reviewed, patient examined, no change in status, stable for endoscopic procedure(s).  I have reviewed the patient's chart and labs.  Questions were answered to the patient's satisfaction.     Alean Kromer E. Candis Schatz, MD [facility]

## 2021-11-28 NOTE — Progress Notes (Unsigned)
Pt's states no medical or surgical changes since previsit or office visit. 

## 2021-11-28 NOTE — Op Note (Signed)
[place] Patient Name: Carl Lane Procedure Date: 11/28/2021 3:51 PM MRN: [id number] Endoscopist: Nicki Reaper E. Candis Schatz , MD, [id number] Age: 46 Referring MD:  Date of Birth: October 25, 1975 Gender: Male Account #: [id number] Procedure:                Colonoscopy Indications:              This is the patient's first colonoscopy,                            Hematochezia Medicines:                Monitored Anesthesia Care Procedure:                Pre-Anesthesia Assessment:                           - Prior to the procedure, a History and Physical                            was performed, and patient medications and                            allergies were reviewed. The patient's tolerance of                            previous anesthesia was also reviewed. The risks                            and benefits of the procedure and the sedation                            options and risks were discussed with the patient.                            All questions were answered, and informed consent                            was obtained. Prior Anticoagulants: The patient has                            taken no anticoagulant or antiplatelet agents. ASA                            Grade Assessment: II - A patient with mild systemic                            disease. After reviewing the risks and benefits,                            the patient was deemed in satisfactory condition to                            undergo the procedure.  After obtaining informed consent, the colonoscope                            was passed under direct vision. Throughout the                            procedure, the patient's blood pressure, pulse, and                            oxygen saturations were monitored continuously. The                            CF [id number] #[id number] was introduced through the anus                            and advanced to the the terminal ileum, with                             identification of the appendiceal orifice and IC                            valve. The colonoscopy was performed without                            difficulty. The patient tolerated the procedure                            well. The quality of the bowel preparation was                            good. The terminal ileum, ileocecal valve,                            appendiceal orifice, and rectum were photographed.                            The bowel preparation used was SUPREP via split                            dose instruction. Scope In: 4:13:40 PM Scope Out: 4:26:37 PM Scope Withdrawal Time: 0 hours 10 minutes 28 seconds  Total Procedure Duration: 0 hours 12 minutes 57 seconds  Findings:                 External hemorrhoids were found on perianal exam.                           The digital rectal exam was normal. Pertinent                            negatives include normal sphincter tone and no                            palpable rectal lesions.  Two sessile polyps were found in the transverse                            colon. The polyps were 2 to 3 mm in size. These                            polyps were removed with a cold snare. Resection                            and retrieval were complete. Estimated blood loss                            was minimal.                           A few small-mouthed diverticula were found in the                            ascending colon.                           The exam was otherwise normal throughout the                            examined colon.                           The terminal ileum appeared normal.                           The retroflexed view of the distal rectum and anal                            verge was normal and showed no anal or rectal                            abnormalities. Complications:            No immediate complications. Estimated Blood Loss:     Estimated blood  loss was minimal. Impression:               - External hemorrhoids found on perianal exam.                           - Two 2 to 3 mm polyps in the transverse colon,                            removed with a cold snare. Resected and retrieved.                           - Diverticulosis in the ascending colon.                           - The examined portion of the ileum was normal.                           -  The distal rectum and anal verge are normal on                            retroflexion view.                           - Although no prominent internal hemorrhoidal                            columns seen on today's examination, this is the                            most likely explanation for the patient's                            hematochezia and passage of clots. Recommendation:           - Patient has a contact number available for                            emergencies. The signs and symptoms of potential                            delayed complications were discussed with the                            patient. Return to normal activities tomorrow.                            Written discharge instructions were provided to the                            patient.                           - Resume previous diet.                           - Continue present medications.                           - Await pathology results.                           - Repeat colonoscopy (date not yet determined) for                            surveillance based on pathology results.                           - Recommend high fiber diet/fiber supplementation Nilani Hugill E. Candis Schatz, MD 11/28/2021 4:36:08 PM This report has been signed electronically.

## 2021-11-28 NOTE — Patient Instructions (Addendum)
Read all of the handouts given to you by your recovery room nurse.  Resume all of you medication today.  YOU HAD AN ENDOSCOPIC PROCEDURE TODAY AT [facility]:   Refer to the procedure report that was given to you for any specific questions about what was found during the examination.  If the procedure report does not answer your questions, please call your gastroenterologist to clarify.  If you requested that your care partner not be given the details of your procedure findings, then the procedure report has been included in a sealed envelope for you to review at your convenience later.  YOU SHOULD EXPECT: Some feelings of bloating in the abdomen. Passage of more gas than usual.  Walking can help get rid of the air that was put into your GI tract during the procedure and reduce the bloating. If you had a lower endoscopy (such as a colonoscopy or flexible sigmoidoscopy) you may notice spotting of blood in your stool or on the toilet paper. If you underwent a bowel prep for your procedure, you may not have a normal bowel movement for a few days.  Please Note:  You might notice some irritation and congestion in your nose or some drainage.  This is from the oxygen used during your procedure.  There is no need for concern and it should clear up in a day or so.  SYMPTOMS TO REPORT IMMEDIATELY:  Following lower endoscopy (colonoscopy or flexible sigmoidoscopy):  Excessive amounts of blood in the stool  Significant tenderness or worsening of abdominal pains  Swelling of the abdomen that is new, acute  Fever of 100F or higher  For urgent or emergent issues, a gastroenterologist can be reached at any hour by calling [phone]. Do not use MyChart messaging for urgent concerns.    DIET:  We do recommend a small meal at first, but then you may proceed to your regular diet.  Drink plenty of fluids but you should avoid alcoholic beverages for 24 hours.  Try to increase the fiber in your  diet, and drink plenty of water.  ACTIVITY:  You should plan to take it easy for the rest of today and you should NOT DRIVE or use heavy machinery until tomorrow (because of the sedation medicines used during the test).    FOLLOW UP: Our staff will call the number listed on your records the next business day following your procedure.  We will call around 7:15- 8:00 am to check on you and address any questions or concerns that you may have regarding the information given to you following your procedure. If we do not reach you, we will leave a message.     If any biopsies were taken you will be contacted by phone or by letter within the next 1-3 weeks.  Please call us at [phone] if you have not heard about the biopsies in 3 weeks.    SIGNATURES/CONFIDENTIALITY: You and/or your care partner have signed paperwork which will be entered into your electronic medical record.  These signatures attest to the fact that that the information above on your After Visit Summary has been reviewed and is understood.  Full responsibility of the confidentiality of this discharge information lies with you and/or your care-partner.

## 2021-11-28 NOTE — Progress Notes (Signed)
Called to room to assist during endoscopic procedure.  Patient ID and intended procedure confirmed with present staff. Received instructions for my participation in the procedure from the performing physician.  

## 2021-11-29 ENCOUNTER — Telehealth: Payer: Self-pay

## 2021-11-29 NOTE — Telephone Encounter (Signed)
  Follow up Call-     11/28/2021    2:58 PM  Call back number  Post procedure Call Back phone  # [phone]  Permission to leave phone message Yes     Patient questions:  Do you have a fever, pain , or abdominal swelling? No. Pain Score  0 *  Have you tolerated food without any problems? Yes.    Have you been able to return to your normal activities? Yes.    Do you have any questions about your discharge instructions: Diet   No. Medications  No. Follow up visit  No.  Do you have questions or concerns about your Care? No.  Actions: * If pain score is 4 or above: No action needed, pain <4.

## 2021-12-13 NOTE — Progress Notes (Signed)
Carl Lane,  The two polyps which I removed during your recent procedure were proven to be completely benign but are considered "pre-cancerous" polyps that MAY have grown into cancer if they had not been removed.  Studies shows that at least 20% of women over age 46 and 30% of men over age 63 have pre-cancerous polyps.  Based on current nationally recognized surveillance guidelines, I recommend that you have a repeat colonoscopy in 7 years.   If you develop any new rectal bleeding, abdominal pain or significant bowel habit changes, please contact me before then.

## 2022-04-24 ENCOUNTER — Encounter: Payer: Self-pay | Admitting: *Deleted

## 2022-04-26 ENCOUNTER — Encounter: Payer: Self-pay | Admitting: Cardiology

## 2022-08-03 ENCOUNTER — Encounter: Payer: Self-pay | Admitting: Family Medicine

## 2022-08-03 ENCOUNTER — Ambulatory Visit: Payer: BLUE CROSS/BLUE SHIELD | Admitting: Family Medicine

## 2022-08-03 ENCOUNTER — Ambulatory Visit (HOSPITAL_BASED_OUTPATIENT_CLINIC_OR_DEPARTMENT_OTHER)
Admission: RE | Admit: 2022-08-03 | Discharge: 2022-08-03 | Disposition: A | Discharge status: Home / Self Care | Payer: BLUE CROSS/BLUE SHIELD | Source: Ambulatory Visit | Attending: Family Medicine | Admitting: Family Medicine

## 2022-08-03 VITALS — BP 110/84 | HR 68 | Temp 98.0°F | Resp 18 | Ht 70.0 in | Wt 242.8 lb

## 2022-08-03 DIAGNOSIS — R109 Unspecified abdominal pain: Secondary | ICD-10-CM | POA: Insufficient documentation

## 2022-08-03 DIAGNOSIS — M5441 Lumbago with sciatica, right side: Secondary | ICD-10-CM | POA: Insufficient documentation

## 2022-08-03 LAB — POC URINALSYSI DIPSTICK (AUTOMATED)
Blood, UA: NEGATIVE
Glucose, UA: NEGATIVE
Leukocytes, UA: NEGATIVE
Nitrite, UA: NEGATIVE
Protein, UA: NEGATIVE
Spec Grav, UA: 1.025 (ref 1.010–1.025)
Urobilinogen, UA: 0.2 E.U./dL
pH, UA: 5 (ref 5.0–8.0)
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
## 2022-08-03 NOTE — Assessment & Plan Note (Signed)
Pt is taking IB for pain He did not want a muscle relaxer  Check xray Consider Pt  Cont chiropractor

## 2022-08-03 NOTE — Assessment & Plan Note (Signed)
Check t spine xray and urine  R/o kidney stone

## 2022-08-07 ENCOUNTER — Encounter: Payer: Self-pay | Admitting: Family Medicine

## 2022-08-21 ENCOUNTER — Other Ambulatory Visit: Payer: Self-pay

## 2022-08-21 DIAGNOSIS — M5441 Lumbago with sciatica, right side: Secondary | ICD-10-CM

## 2022-08-21 MED ORDER — CYCLOBENZAPRINE HCL 10 MG PO TABS: 10.0000 mg | ORAL_TABLET | Freq: Three times a day (TID) | ORAL | 1 refills | Status: DC | PRN

## 2022-08-28 ENCOUNTER — Ambulatory Visit: Payer: BLUE CROSS/BLUE SHIELD | Admitting: Physical Medicine and Rehabilitation

## 2023-06-12 ENCOUNTER — Ambulatory Visit: Admitting: Sports Medicine

## 2023-06-12 VITALS — BP 97/62 | Ht 70.0 in | Wt 242.0 lb

## 2023-06-12 DIAGNOSIS — S76012A Strain of muscle, fascia and tendon of left hip, initial encounter: Secondary | ICD-10-CM | POA: Diagnosis not present

## 2023-06-12 NOTE — Progress Notes (Signed)
   Subjective:    Patient ID: Carl Lane, male    DOB: 13-Sep-1975, 48 y.o.   MRN: [id number]  HPI chief complaint: Left-sided abdominal pain  Patient is a very pleasant 48 year old male that comes in today concerned about some pain on the left side of his body that began week.  He acutely felt pain along the left groin area up into the lower abdomen when bending over to pick up some wood.  His main concern today is for hernia.  He has not noticed any swelling or bulging.  No ecchymosis.  His pain is most noticeable when lifting heavy objects.  He has not tried any over-the-counter pain medication.  Past medical history reviewed Medications reviewed Allergies reviewed   Review of Systems As above    Objective:   Physical Exam  Well-developed, well-nourished.  No acute distress.  Abdominal exam shows no evidence of direct or indirect hernia.  There is some tenderness to palpation along the lower left rectus.  No ecchymosis.  No swelling.  Left hip: Smooth painless hip range of motion with a negative logroll.  Good strength with resisted hip flexion.  No significant pain with hip flexion.  No pain with attempted sit up.  Good strength and no pain with resisted adduction.      Assessment & Plan:   Left hip/lower abdominal pain likely secondary to muscle strain  Reassurance that I do not appreciate any evidence of hernia today.  I would look for his symptoms to improve over time.  If pain persists or he develops any new signs or symptoms then he will return to the office for reevaluation.  Otherwise, follow-up as needed.  This note was dictated using Dragon naturally speaking software and may contain errors in syntax, spelling, or content which have not been identified prior to signing this note.

## 2023-06-13 ENCOUNTER — Ambulatory Visit: Admitting: Sports Medicine

## 2023-08-28 ENCOUNTER — Encounter: Payer: Self-pay | Admitting: Family Medicine

## 2023-08-30 ENCOUNTER — Ambulatory Visit: Admitting: Family Medicine

## 2023-08-30 ENCOUNTER — Ambulatory Visit (HOSPITAL_BASED_OUTPATIENT_CLINIC_OR_DEPARTMENT_OTHER)
Admission: RE | Admit: 2023-08-30 | Discharge: 2023-08-30 | Disposition: A | Discharge status: Home / Self Care | Source: Ambulatory Visit | Attending: Family Medicine | Admitting: Family Medicine

## 2023-08-30 VITALS — BP 112/82 | HR 80 | Temp 98.2°F | Resp 18 | Ht 70.0 in | Wt 247.6 lb

## 2023-08-30 DIAGNOSIS — S161XXA Strain of muscle, fascia and tendon at neck level, initial encounter: Secondary | ICD-10-CM

## 2023-08-30 DIAGNOSIS — M542 Cervicalgia: Secondary | ICD-10-CM | POA: Diagnosis present

## 2023-08-30 MED ORDER — CYCLOBENZAPRINE HCL 10 MG PO TABS
10.0000 mg | ORAL_TABLET | Freq: Three times a day (TID) | ORAL | 1 refills | Status: DC | PRN
Start: 2023-08-30 — End: 2023-11-20

## 2023-08-30 MED ORDER — PREDNISONE 10 MG PO TABS: ORAL_TABLET | ORAL | 0 refills | Status: DC

## 2023-08-30 NOTE — Progress Notes (Signed)
 5++++ ++  Subjective:    Patient ID: Carl Lane, male    DOB: 1975-07-02, 48 y.o.   MRN: [id number]  Chief Complaint  Patient presents with   Back Pain    Upper back and shoulders, x1 year, was in a accident about a year ago n the pain has been moving. Pt states he was doing BJJ had someone move his head wrong.     HPI Patient is in today for BACK PAIN.   Discussed the use of AI scribe software for clinical note transcription with the patient, who gave verbal consent to proceed.  History of Present Illness Carl Lane is a 48 year old male who presents with acute neck pain following a jujitsu training incident.  He experienced acute neck pain during a jujitsu training session on Sunday. The pain originates in the lower neck and radiates down the spine, causing severe discomfort and involuntary screaming. Movement exacerbates the pain, making daily activities like taking off his shirt or pushing himself up difficult.  He has a history of back issues, including lower and middle back pain, which previously improved with chiropractic adjustments and yoga stretches. He took muscle relaxers prescribed last year, noting some improvement after taking them on Sunday and Monday.  By Wednesday, he felt slightly better but experienced a spasm of pain while turning his head to check traffic, indicating persistent tightness and pain in the neck and shoulder blade area. 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  He has been training jujitsu three days a week for the past three months and notes that the physical activity is strenuous. He has gained weight due to a period of inactivity following previous back issues. He is preparing for a backpacking trip next year and is concerned about his physical condition.  He has not taken any more muscle relaxers since Monday and has not  found a suitable massage therapist for deep tissue work. He has a neighbor who is a physical therapist but has not pursued treatment with her due to personal reasons.    Past Medical History:  Diagnosis Date   Abdominal pain, right lower quadrant 08/21/2006   Qualifier: Diagnosis of  By: Tita MD, Luis     ABDOMINAL PAIN, UPPER 04/09/2009   Qualifier: Diagnosis of  By: Amon MD, Aloysius BRAVO.    Anxiety    ANXIETY 03/12/2009   Qualifier: Diagnosis of  By: Tish MD, Elsie     Arthritis    Backache 03/12/2009   Qualifier: Diagnosis of  By: Joshua CMA, Chemira     BENIGN POSITIONAL VERTIGO, HX OF 10/04/2007   Qualifier: Diagnosis of  By: Antonio DO, Avielle Imbert     Biceps strain 01/26/2011   Chest pain 12/11/2017   Closed displaced fracture of proximal phalanx of lesser toe of right foot 01/20/2021   Gastroesophageal reflux disease without esophagitis 05/22/2006   Generalized anxiety disorder 03/12/2009   GERD 05/22/2006   Qualifier: Diagnosis of  By: Tita MD, Luis     GERD (gastroesophageal reflux disease)    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   Qualifier: Diagnosis of  By: Tita MD, Luis     Hyperlipidemia    HYPERLIPIDEMIA 03/18/2008   Qualifier: Diagnosis of  By: Antonio ROSALEA Rockers     Hyperlipidemia LDL goal <100 05/22/2017   Injury of left toe 07/16/2012   Insomnia 03/02/2021  Left arm pain 05/22/2017   Loud snoring 03/02/2021   Mixed hyperlipidemia 03/18/2008   Obesity (BMI 30.0-34.9) 06/09/2020   Obesity due to excess calories with serious comorbidity 06/09/2020   OPEN WOUND FT NO TOE ALONE WITHOUT MENTION COMP 05/28/2009   Qualifier: Diagnosis of  By: Antonio ROSALEA Rockers     ORCHITIS/EPIDIDYMITIS NOS 08/21/2006   Qualifier: Diagnosis of  By: Tita MD, Sheree SOUS DISORDER 05/21/2006   Qualifier: Diagnosis of  By: Tita MD, Luis     Pneumonia    Preventative health care 12/10/2017   Right elbow pain 11/14/2011   Right shoulder pain 10/26/2014   Vertigo  04/03/2016   Overview:   Formatting of this note might be different from the original.   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; Frenulum surgery as child    Family History  Problem Relation Age of Onset   Atrial fibrillation Mother        treated with medication   Diabetes Mother    Emphysema Mother    COPD Mother    Hypertension Father    Hyperlipidemia Father    Heart disease Father    Lupus Sister    Other Sister        bicuspid aortic disease and had it replaced at 35   Ovarian cancer Sister    Heart attack Maternal Grandfather        died at 49   Lung cancer Paternal Grandmother    Heart disease Paternal Grandfather    Diabetes Paternal Grandfather    Other Maternal Aunt        bicuspbid aortic disease and had it replaced at 52   Heart disease Maternal Uncle        quadrupal bypass   Heart attack Cousin        maternal cousin   Sudden death Neg Hx     Social History   Socioeconomic History   Marital status: Married    Spouse name: Not on file   Number of children: 2   Years of education: Not on file   Highest education level: Associate degree: occupational, Scientist, product/process development, or vocational program  Occupational History   Occupation: Tree surgeon  Tobacco Use   Smoking status: Never   Smokeless tobacco: Never  Vaping Use   Vaping status: Never Used  Substance and Sexual Activity   Alcohol use: Yes    Comment: once a month   Drug use: No   Sexual activity: Not on file  Other Topics Concern   Not on file  Social History Narrative   Regular exercise- intermittent   Social Drivers of Health   Financial Resource Strain: Low Risk  (08/30/2023)   Overall Financial Resource Strain (CARDIA)    Difficulty of Paying Living Expenses: Not very hard  Food Insecurity: No Food Insecurity (08/30/2023)   Hunger Vital Sign    Worried About Running Out of  Food in the Last Year: Never true    Ran Out of Food in the Last Year: Never true  Transportation Needs: No Transportation Needs (08/30/2023)   PRAPARE - Administrator, Civil Service (Medical): No    Lack of Transportation (Non-Medical): No  Physical Activity: Insufficiently Active (08/30/2023)   Exercise Vital Sign    Days of Exercise per Week: 2 days    Minutes  of Exercise per Session: 30 min  Stress: Stress Concern Present (08/30/2023)   [employer] of Occupational Health - Occupational Stress Questionnaire    Feeling of Stress: Rather much  Social Connections: Socially Integrated (08/30/2023)   Social Connection and Isolation Panel    Frequency of Communication with Friends and Family: Twice a week    Frequency of Social Gatherings with Friends and Family: Twice a week    Attends Religious Services: 1 to 4 times per year    Active Member of [employer] or Organizations: Yes    Attends Engineer, structural: More than 4 times per year    Marital Status: Married  Catering manager Violence: Low Risk  (10/30/2019)   Received from [facility]   Intimate Partner Violence    Insults You: Not on file    Threatens You: Not on file    Screams at [place]: Not on file    Physically Hurt: Not on file    Intimate Partner Violence Score: Not on file    Outpatient Medications Prior to Visit  Medication Sig Dispense Refill   ALPRAZolam  (XANAX ) 1 MG tablet Take 1 mg by mouth 3 (three) times daily as needed for anxiety.     FLUoxetine (PROZAC) 40 MG capsule Take 80 mg by mouth daily.     cyclobenzaprine  (FLEXERIL ) 10 MG tablet Take 1 tablet (10 mg total) by mouth 3 (three) times daily as needed for muscle spasms. 30 tablet 1   No facility-administered medications prior to visit.    No Known Allergies  Review of Systems  Constitutional:  Negative for chills, fever and malaise/fatigue.  HENT:  Negative for congestion and hearing loss.   Eyes:  Negative for discharge.   Respiratory:  Negative for cough, sputum production and shortness of breath.   Cardiovascular:  Negative for chest pain, palpitations and leg swelling.  Gastrointestinal:  Negative for abdominal pain, blood in stool, constipation, diarrhea, heartburn, nausea and vomiting.  Genitourinary:  Negative for dysuria, frequency, hematuria and urgency.  Musculoskeletal:  Positive for neck pain. Negative for back pain, falls and myalgias.  Skin:  Negative for rash.  Neurological:  Negative for dizziness, sensory change, loss of consciousness, weakness and headaches.  Endo/Heme/Allergies:  Negative for environmental allergies. Does not bruise/bleed easily.  Psychiatric/Behavioral:  Negative for depression and suicidal ideas. The patient is not nervous/anxious and does not have insomnia.        Objective:    Physical Exam Vitals and nursing note reviewed.  Constitutional:      General: He is not in acute distress.    Appearance: Normal appearance. He is well-developed.  HENT:     Head: Normocephalic and atraumatic.     Right Ear: Tympanic membrane, ear canal and external ear normal. There is no impacted cerumen.     Left Ear: Tympanic membrane, ear canal and external ear normal. There is no impacted cerumen.     Nose: Nose normal.     Mouth/Throat:     Mouth: Mucous membranes are moist.     Pharynx: Oropharynx is clear. No oropharyngeal exudate or posterior oropharyngeal erythema.  Eyes:     General: No scleral icterus.       Right eye: No discharge.        Left eye: No discharge.     Conjunctiva/sclera: Conjunctivae normal.     Pupils: Pupils are equal, round, and reactive to light.  Neck:     Thyroid : No thyromegaly.     Vascular:  No JVD.  Cardiovascular:     Rate and Rhythm: Normal rate and regular rhythm.     Heart sounds: Normal heart sounds. No murmur heard. Pulmonary:     Effort: Pulmonary effort is normal. No respiratory distress.     Breath sounds: Normal breath sounds.   Abdominal:     General: Bowel sounds are normal. There is no distension.     Palpations: Abdomen is soft. There is no mass.     Tenderness: There is no abdominal tenderness. There is no guarding or rebound.  Musculoskeletal:     Cervical back: Neck supple. Tenderness present. Decreased range of motion.     Right lower leg: No edema.     Left lower leg: No edema.  Lymphadenopathy:     Cervical: No cervical adenopathy.  Skin:    General: Skin is warm and dry.     Findings: No erythema or rash.  Neurological:     Mental Status: He is alert and oriented to person, place, and time.     Cranial Nerves: No cranial nerve deficit.     Motor: No abnormal muscle tone.     Deep Tendon Reflexes: Reflexes are normal and symmetric. Reflexes normal.  Psychiatric:        Mood and Affect: Mood normal.        Behavior: Behavior normal.        Thought Content: Thought content normal.        Judgment: Judgment normal.     BP 112/82 (BP Location: Right Arm, Patient Position: Sitting, Cuff Size: Normal)   Pulse 80   Temp 98.2 F (36.8 C) (Oral)   Resp 18   Ht 5' 10 (1.778 m)   Wt 247 lb 9.6 oz (112.3 kg)   SpO2 96%   BMI 35.53 kg/m  Wt Readings from Last 3 Encounters:  08/30/23 247 lb 9.6 oz (112.3 kg)  06/12/23 242 lb (109.8 kg)  08/03/22 242 lb 12.8 oz (110.1 kg)    Diabetic Foot Exam - Simple   No data filed    Lab Results  Component Value Date   WBC 7.6 10/10/2021   HGB 14.4 10/10/2021   HCT 42.8 10/10/2021   PLT 278.0 10/10/2021   GLUCOSE 100 (H) 10/10/2021   CHOL 196 10/10/2021   TRIG 233.0 (H) 10/10/2021   HDL 29.30 (L) 10/10/2021   LDLDIRECT 134.0 10/10/2021   LDLCALC 113 (H) 06/03/2020   ALT 25 10/10/2021   AST 16 10/10/2021   NA 139 10/10/2021   K 4.0 10/10/2021   CL 104 10/10/2021   CREATININE 1.10 10/10/2021   BUN 17 10/10/2021   CO2 28 10/10/2021   TSH 1.90 10/10/2021   PSA 0.22 10/10/2021    Lab Results  Component Value Date   TSH 1.90 10/10/2021    Lab Results  Component Value Date   WBC 7.6 10/10/2021   HGB 14.4 10/10/2021   HCT 42.8 10/10/2021   MCV 83.9 10/10/2021   PLT 278.0 10/10/2021   Lab Results  Component Value Date   NA 139 10/10/2021   K 4.0 10/10/2021   CO2 28 10/10/2021   GLUCOSE 100 (H) 10/10/2021   BUN 17 10/10/2021   CREATININE 1.10 10/10/2021   BILITOT 1.2 10/10/2021   ALKPHOS 51 10/10/2021   AST 16 10/10/2021   ALT 25 10/10/2021   PROT 6.8 10/10/2021   ALBUMIN 4.2 10/10/2021   CALCIUM  9.4 10/10/2021   GFR 80.76 10/10/2021   Lab Results  Component  Value Date   CHOL 196 10/10/2021   Lab Results  Component Value Date   HDL 29.30 (L) 10/10/2021   Lab Results  Component Value Date   LDLCALC 113 (H) 06/03/2020   Lab Results  Component Value Date   TRIG 233.0 (H) 10/10/2021   Lab Results  Component Value Date   CHOLHDL 7 10/10/2021   No results found for: HGBA1C     Assessment & Plan:  Neck pain -     DG Cervical Spine Complete; Future  Cervical strain, acute, initial encounter -     Cyclobenzaprine  HCl; Take 1 tablet (10 mg total) by mouth 3 (three) times daily as needed for muscle spasms.  Dispense: 30 tablet; Refill: 1 -     predniSONE ; TAKE 3 TABLETS PO QD FOR 3 DAYS THEN TAKE 2 TABLETS PO QD FOR 3 DAYS THEN TAKE 1 TABLET PO QD FOR 3 DAYS THEN TAKE 1/2 TAB PO QD FOR 3 DAYS  Dispense: 20 tablet; Refill: 0   Assessment and Plan Assessment & Plan Acute neck and upper back pain with radicular symptoms   He experiences severe neck and upper back pain with radicular symptoms following a jujitsu training incident. The pain radiates from the lower neck down the spine into the shoulder blades and around the ribs, but not into the hands or legs. Movement, especially quick or jerky motions, exacerbates the pain. Muscle relaxers previously provided some relief. The differential diagnosis includes muscular strain versus possible disc pathology, such as a herniated or bulging disc. An x-ray  will not show disc herniation but may indicate disc space narrowing suggestive of disc pathology. Prescribe prednisone  for one week to reduce inflammation and assess if symptoms are muscular in origin. Order an x-ray of the neck to check for disc space narrowing or other abnormalities. Refill muscle relaxers as the previous prescription is a year old. Refer to chiropractor Dr. Salama for further evaluation and management. Provide contact information for a massage therapist for potential deep tissue massage.   Rashea Hoskie R Lowne Chase, DO

## 2023-09-12 DIAGNOSIS — M1990 Unspecified osteoarthritis, unspecified site: Secondary | ICD-10-CM | POA: Insufficient documentation

## 2023-09-12 DIAGNOSIS — J189 Pneumonia, unspecified organism: Secondary | ICD-10-CM | POA: Insufficient documentation

## 2023-09-13 ENCOUNTER — Ambulatory Visit: Attending: Cardiology | Admitting: Cardiology

## 2023-09-13 ENCOUNTER — Ambulatory Visit: Payer: Self-pay | Admitting: Family Medicine

## 2023-09-13 ENCOUNTER — Other Ambulatory Visit: Payer: Self-pay | Admitting: Family Medicine

## 2023-09-13 ENCOUNTER — Encounter: Payer: Self-pay | Admitting: Cardiology

## 2023-09-13 VITALS — BP 114/78 | HR 87 | Ht 71.0 in | Wt 243.1 lb

## 2023-09-13 DIAGNOSIS — S161XXA Strain of muscle, fascia and tendon at neck level, initial encounter: Secondary | ICD-10-CM

## 2023-09-13 DIAGNOSIS — E66811 Obesity, class 1: Secondary | ICD-10-CM

## 2023-09-13 DIAGNOSIS — R0989 Other specified symptoms and signs involving the circulatory and respiratory systems: Secondary | ICD-10-CM | POA: Diagnosis not present

## 2023-09-13 DIAGNOSIS — E781 Pure hyperglyceridemia: Secondary | ICD-10-CM | POA: Diagnosis not present

## 2023-09-13 DIAGNOSIS — E782 Mixed hyperlipidemia: Secondary | ICD-10-CM | POA: Diagnosis not present

## 2023-09-13 NOTE — Addendum Note (Signed)
 Addended by: SHERRE ADE I on: 09/13/2023 04:11 PM   Modules accepted: Orders

## 2023-09-13 NOTE — Patient Instructions (Addendum)
 Medication Instructions:  Your physician recommends that you continue on your current medications as directed. Please refer to the Current Medication list given to you today.  *If you need a refill on your cardiac medications before your next appointment, please call your pharmacy*  Lab Work: None If you have labs (blood work) drawn today and your tests are completely normal, you will receive your results only by: MyChart Message (if you have MyChart) OR A paper copy in the mail If you have any lab test that is abnormal or we need to change your treatment, we will call you to review the results.  Testing/Procedures: We will order CT coronary calcium  score. It will cost $99.00 and is due at time of scan.  Please call to schedule.    [facility] at [facility] [address] [place], [place] [address] [phone]  [facility] [address] [place], [place] [address] [phone]  Your physician has requested that you have an abdominal aorta duplex. During this test, an ultrasound is used to evaluate the aorta. Allow 30 minutes for this exam. Do not eat after midnight the day before and avoid carbonated beverages.  Please note: We ask at that you not bring children with you during ultrasound (echo/ vascular) testing. Due to room size and safety concerns, children are not allowed in the ultrasound rooms during exams. Our front office staff cannot provide observation of children in our lobby area while testing is being conducted. An adult accompanying a patient to their appointment will only be allowed in the ultrasound room at the discretion of the ultrasound technician under special circumstances. We apologize for any inconvenience.      Follow-Up: At [facility], you and your health needs are our priority.  As part of our continuing mission to provide you with exceptional heart care, our providers are all part of one team.  This  team includes your primary Cardiologist (physician) and Advanced Practice Providers or APPs (Physician Assistants and Nurse Practitioners) who all work together to provide you with the care you need, when you need it.  Your next appointment:   1 year(s)  Provider:   Jennifer Crape, MD    We recommend signing up for the patient portal called MyChart.  Sign up information is provided on this After Visit Summary.  MyChart is used to connect with patients for Virtual Visits (Telemedicine).  Patients are able to view lab/test results, encounter notes, upcoming appointments, etc.  Non-urgent messages can be sent to your provider as well.   To learn more about what you can do with MyChart, go to [email or web address].   Other Instructions None

## 2023-09-13 NOTE — Progress Notes (Addendum)
 Cardiology Office Note:    Date:  09/13/2023   ID:  [name], DOB 1975-12-29, MRN [id number]  PCP:  Antonio Meth, Jamee SAUNDERS, DO  Cardiologist:  Jennifer SAUNDERS Crape, MD   Referring MD: Antonio Meth, Jamee SAUNDERS, *    ASSESSMENT:    1. Pure hypertriglyceridemia   2. Mixed hyperlipidemia   3. Obesity (BMI 30.0-34.9)   4. Prominent abdominal aortic pulsation    PLAN:    In order of problems listed above:  Primary prevention stressed with the patient.  Importance of compliance with diet medication stressed and patient verbalized standing. He was advised to ambulate to the best of his ability.  He tells me that he has completely recovered from his back injury. Mixed dyslipidemia: Not on lipid-lowering medications.  I would like to get a calcium  score for risk stratification and he is agreeable.  This will help me focus appropriately on targets for lipid-lowering.  Diet emphasized. Obesity: Weight reduction stressed diet emphasized and he promises to do better. Patient will be seen in follow-up appointment in 6 months or earlier if the patient has any concerns. In view of prominent abdominal pulsations we will do an abdominal ultrasound to rule out aneurysm.    Medication Adjustments/Labs and Tests Ordered: Current medicines are reviewed at length with the patient today.  Concerns regarding medicines are outlined above.  Orders Placed This Encounter  Procedures   CT CARDIAC SCORING   EKG 12-Lead   VAS US  AAA DUPLEX   No orders of the defined types were placed in this encounter.    No chief complaint on file.    History of Present Illness:    Carl Lane is a 48 y.o. male.  Patient has past medical history of mixed dyslipidemia.  He mentions to me that he hurt his back and has been sedentary.  He denies any chest pain orthopnea or PND.  He takes care of activities of daily living.  At the time of my evaluation, the patient is alert awake oriented and in no distress.  He has  gained weight.  Past Medical History:  Diagnosis Date   Abdominal bruit 09/08/2021   Abdominal pain, right lower quadrant 08/21/2006   Qualifier: Diagnosis of  By: Tita MD, Luis     Acute right-sided low back pain with right-sided sciatica 03/12/2009   Qualifier: Diagnosis of   By: Joshua CMA, Chemira         Anxiety    ANXIETY 03/12/2009   Qualifier: Diagnosis of   By: Tish MD, Elsie      Replacing diagnoses that were inactivated after the 04/10/22 regulatory import     Arthritis    Backache 03/12/2009   Qualifier: Diagnosis of  By: Joshua CMA, Chemira     BENIGN POSITIONAL VERTIGO, HX OF 10/04/2007   Qualifier: Diagnosis of  By: Antonio DO, Yvonne     Biceps strain 01/26/2011   Chest pain 12/11/2017   Closed displaced fracture of proximal phalanx of lesser toe of right foot 01/20/2021   Flank pain 04/09/2009   Qualifier: Diagnosis of   By: Amon MD, Jose E.        Gastroesophageal reflux disease without esophagitis 05/22/2006   Generalized anxiety disorder 03/12/2009   GERD 05/22/2006   Qualifier: Diagnosis of  By: Tita MD, Sheree NOSS SYNDROME 05/21/2006   Qualifier: Diagnosis of   By: Tita MD, Luis      Replacing diagnoses that were inactivated after  the 04/10/22 regulatory import     Hyperlipidemia    HYPERLIPIDEMIA 03/18/2008   Qualifier: Diagnosis of  By: Antonio DO, Jamee     Hyperlipidemia LDL goal <100 05/22/2017   Injury of left toe 07/16/2012   Insomnia 03/02/2021   Left arm pain 05/22/2017   Loud snoring 03/02/2021   Mixed hyperlipidemia 03/18/2008   Obesity (BMI 30.0-34.9) 06/09/2020   Obesity due to excess calories with serious comorbidity 06/09/2020   OPEN WOUND FT NO TOE ALONE WITHOUT MENTION COMP 05/28/2009   Qualifier: Diagnosis of   By: Antonio ROSALEA Jamee      Replacing diagnoses that were inactivated after the 04/10/22 regulatory import     ORCHITIS/EPIDIDYMITIS NOS 08/21/2006   Qualifier: Diagnosis of   By: Tita MD, Luis      Replacing  diagnoses that were inactivated after the 04/10/22 regulatory import     PANIC DISORDER 05/21/2006   Qualifier: Diagnosis of  By: Tita MD, Luis     Pneumonia    Preventative health care 12/10/2017   Rectal bleeding 10/10/2021   Right elbow pain 11/14/2011   Right shoulder pain 10/26/2014   Vertigo 04/03/2016   Overview:   Formatting of this note might be different from the original.   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; Frenulum surgery as child    Current Medications: Current Meds  Medication Sig   ALPRAZolam  (XANAX ) 1 MG tablet Take 1 mg by mouth 3 (three) times daily as needed for anxiety.   cyclobenzaprine  (FLEXERIL ) 10 MG tablet Take 1 tablet (10 mg total) by mouth 3 (three) times daily as needed for muscle spasms.   FLUoxetine (PROZAC) 40 MG capsule Take 80 mg by mouth daily.   predniSONE  (DELTASONE ) 10 MG tablet TAKE 3 TABLETS PO QD FOR 3 DAYS THEN TAKE 2 TABLETS PO QD FOR 3 DAYS THEN TAKE 1 TABLET PO QD FOR 3 DAYS THEN TAKE 1/2 TAB PO QD FOR 3 DAYS     Allergies:   Patient has no known allergies.   Social History   Socioeconomic History   Marital status: Married    Spouse name: Not on file   Number of children: 2   Years of education: Not on file   Highest education level: Associate degree: occupational, Scientist, product/process development, or vocational program  Occupational History   Occupation: Tree surgeon  Tobacco Use   Smoking status: Never   Smokeless tobacco: Never  Vaping Use   Vaping status: Never Used  Substance and Sexual Activity   Alcohol use: Yes    Comment: once a month   Drug use: No   Sexual activity: Not on file  Other Topics Concern   Not on file  Social History Narrative   Regular exercise- intermittent   Social Drivers of Health   Financial Resource Strain: Low Risk  (08/30/2023)   Overall Financial Resource Strain (CARDIA)     Difficulty of Paying Living Expenses: Not very hard  Food Insecurity: No Food Insecurity (08/30/2023)   Hunger Vital Sign    Worried About Running Out of Food in the Last Year: Never true    Ran Out of Food in the Last Year: Never true  Transportation Needs: No Transportation Needs (08/30/2023)   PRAPARE - Administrator, Civil Service (Medical): No    Lack of Transportation (Non-Medical): No  Physical Activity:  Insufficiently Active (08/30/2023)   Exercise Vital Sign    Days of Exercise per Week: 2 days    Minutes of Exercise per Session: 30 min  Stress: Stress Concern Present (08/30/2023)   [employer] of Occupational Health - Occupational Stress Questionnaire    Feeling of Stress: Rather much  Social Connections: Socially Integrated (08/30/2023)   Social Connection and Isolation Panel    Frequency of Communication with Friends and Family: Twice a week    Frequency of Social Gatherings with Friends and Family: Twice a week    Attends Religious Services: 1 to 4 times per year    Active Member of [employer] or Organizations: Yes    Attends Engineer, structural: More than 4 times per year    Marital Status: Married     Family History: The patient's family history includes Atrial fibrillation in his mother; COPD in his mother; Diabetes in his mother and paternal grandfather; Emphysema in his mother; Heart attack in his cousin and maternal grandfather; Heart disease in his father, maternal uncle, and paternal grandfather; Hyperlipidemia in his father; Hypertension in his father; Lung cancer in his paternal grandmother; Lupus in his sister; Other in his maternal aunt and sister; Ovarian cancer in his sister. There is no history of Sudden death.  ROS:   Please see the history of present illness.    All other systems reviewed and are negative.  EKGs/Labs/Other Studies Reviewed:    The following studies were reviewed today: .SABRAEKG Interpretation Date/Time:  Thursday  September 13 2023 15:24:27 EDT Ventricular Rate:  87 PR Interval:  136 QRS Duration:  76 QT Interval:  346 QTC Calculation: 416 R Axis:   42  Text Interpretation: Normal sinus rhythm Anterior infarct , age undetermined No previous ECGs available Confirmed by Edwyna Backers [phone]) on 09/13/2023 3:39:18 PM  '   Recent Labs: No results found for requested labs within last 365 days.  Recent Lipid Panel    Component Value Date/Time   CHOL 196 10/10/2021 1023   TRIG 233.0 (H) 10/10/2021 1023   HDL 29.30 (L) 10/10/2021 1023   CHOLHDL 7 10/10/2021 1023   VLDL 46.6 (H) 10/10/2021 1023   LDLCALC 113 (H) 06/03/2020 1004   LDLDIRECT 134.0 10/10/2021 1023    Physical Exam:    VS:  BP 114/78   Pulse 87   Ht 5' 11 (1.803 m)   Wt 243 lb 1.9 oz (110.3 kg)   SpO2 96%   BMI 33.91 kg/m     Wt Readings from Last 3 Encounters:  09/13/23 243 lb 1.9 oz (110.3 kg)  08/30/23 247 lb 9.6 oz (112.3 kg)  06/12/23 242 lb (109.8 kg)     GEN: Patient is in no acute distress HEENT: Normal NECK: No JVD; No carotid bruits LYMPHATICS: No lymphadenopathy CARDIAC: Hear sounds regular, 2/6 systolic murmur at the apex. RESPIRATORY:  Clear to auscultation without rales, wheezing or rhonchi  ABDOMEN: Soft, non-tender, non-distended prominent abdominal pulsations noted MUSCULOSKELETAL:  No edema; No deformity  SKIN: Warm and dry NEUROLOGIC:  Alert and oriented x 3 PSYCHIATRIC:  Normal affect   Signed, Backers JONELLE Edwyna, MD  09/13/2023 4:14 PM    [facility]

## 2023-09-17 ENCOUNTER — Other Ambulatory Visit: Payer: Self-pay | Admitting: Cardiology

## 2023-09-17 ENCOUNTER — Ambulatory Visit (HOSPITAL_BASED_OUTPATIENT_CLINIC_OR_DEPARTMENT_OTHER)
Admission: RE | Admit: 2023-09-17 | Discharge: 2023-09-17 | Disposition: A | Discharge status: Home / Self Care | Payer: Self-pay | Source: Ambulatory Visit | Attending: Cardiology | Admitting: Cardiology

## 2023-09-17 DIAGNOSIS — E782 Mixed hyperlipidemia: Secondary | ICD-10-CM | POA: Insufficient documentation

## 2023-09-17 DIAGNOSIS — E781 Pure hyperglyceridemia: Secondary | ICD-10-CM | POA: Insufficient documentation

## 2023-09-17 DIAGNOSIS — E66811 Obesity, class 1: Secondary | ICD-10-CM

## 2023-09-17 DIAGNOSIS — R0989 Other specified symptoms and signs involving the circulatory and respiratory systems: Secondary | ICD-10-CM

## 2023-10-09 ENCOUNTER — Ambulatory Visit: Attending: Family Medicine

## 2023-10-09 ENCOUNTER — Ambulatory Visit: Admitting: Cardiology

## 2023-10-09 ENCOUNTER — Other Ambulatory Visit: Payer: Self-pay

## 2023-10-09 DIAGNOSIS — S161XXA Strain of muscle, fascia and tendon at neck level, initial encounter: Secondary | ICD-10-CM | POA: Diagnosis not present

## 2023-10-09 DIAGNOSIS — R293 Abnormal posture: Secondary | ICD-10-CM | POA: Insufficient documentation

## 2023-10-09 DIAGNOSIS — M5459 Other low back pain: Secondary | ICD-10-CM | POA: Insufficient documentation
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
## 2023-10-11 ENCOUNTER — Ambulatory Visit (HOSPITAL_BASED_OUTPATIENT_CLINIC_OR_DEPARTMENT_OTHER)
Admission: RE | Admit: 2023-10-11 | Discharge: 2023-10-11 | Disposition: A | Discharge status: Home / Self Care | Source: Ambulatory Visit | Attending: Cardiology | Admitting: Cardiology

## 2023-10-11 DIAGNOSIS — R0989 Other specified symptoms and signs involving the circulatory and respiratory systems: Secondary | ICD-10-CM | POA: Diagnosis present

## 2023-10-15 ENCOUNTER — Ambulatory Visit: Payer: Self-pay | Admitting: Cardiology

## 2023-10-18 ENCOUNTER — Ambulatory Visit: Attending: Family Medicine

## 2023-10-18 DIAGNOSIS — R293 Abnormal posture: Secondary | ICD-10-CM | POA: Insufficient documentation

## 2023-10-18 DIAGNOSIS — M5459 Other low back pain: Secondary | ICD-10-CM | POA: Insufficient documentation
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
## 2023-10-23 ENCOUNTER — Encounter

## 2023-10-30 ENCOUNTER — Ambulatory Visit

## 2023-10-30 DIAGNOSIS — M5459 Other low back pain: Secondary | ICD-10-CM

## 2023-10-30 DIAGNOSIS — R293 Abnormal posture: Secondary | ICD-10-CM
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
## 2023-11-06 ENCOUNTER — Ambulatory Visit

## 2023-11-20 ENCOUNTER — Encounter: Payer: Self-pay | Admitting: Family Medicine

## 2023-11-20 ENCOUNTER — Ambulatory Visit (INDEPENDENT_AMBULATORY_CARE_PROVIDER_SITE_OTHER): Admitting: Family Medicine

## 2023-11-20 VITALS — BP 102/70 | HR 68 | Temp 98.0°F | Resp 18 | Ht 71.0 in | Wt 251.8 lb

## 2023-11-20 DIAGNOSIS — Z23 Encounter for immunization: Secondary | ICD-10-CM | POA: Diagnosis not present

## 2023-11-20 DIAGNOSIS — Z0000 Encounter for general adult medical examination without abnormal findings: Secondary | ICD-10-CM

## 2023-11-20 LAB — LIPID PANEL
Cholesterol: 190 mg/dL (ref 0–200)
HDL: 28.6 mg/dL — ABNORMAL LOW (ref >39.00)
LDL Cholesterol: 125 mg/dL — ABNORMAL HIGH (ref 0–99)
NonHDL: 161.26
Total CHOL/HDL Ratio: 7
Triglycerides: 181 mg/dL — ABNORMAL HIGH (ref 0.0–149.0)
VLDL: 36.2 mg/dL (ref 0.0–40.0)

## 2023-11-20 LAB — COMPREHENSIVE METABOLIC PANEL WITH GFR
ALT: 22 U/L (ref 0–53)
AST: 17 U/L (ref 0–37)
Albumin: 4.1 g/dL (ref 3.5–5.2)
Alkaline Phosphatase: 45 U/L (ref 39–117)
BUN: 15 mg/dL (ref 6–23)
CO2: 29 meq/L (ref 19–32)
Calcium: 9.3 mg/dL (ref 8.4–10.5)
Chloride: 103 meq/L (ref 96–112)
Creatinine, Ser: 0.98 mg/dL (ref 0.40–1.50)
GFR: 91.4 mL/min (ref >60.00)
Glucose, Bld: 98 mg/dL (ref 70–99)
Potassium: 4.6 meq/L (ref 3.5–5.1)
Sodium: 139 meq/L (ref 135–145)
Total Bilirubin: 0.8 mg/dL (ref 0.2–1.2)
Total Protein: 6.8 g/dL (ref 6.0–8.3)

## 2023-11-20 LAB — PSA: PSA: 0.27 ng/mL (ref 0.10–4.00)

## 2023-11-20 LAB — CBC WITH DIFFERENTIAL/PLATELET
Basophils Absolute: 0 K/uL (ref 0.0–0.1)
Basophils Relative: 0.6 % (ref 0.0–3.0)
Eosinophils Absolute: 0.2 K/uL (ref 0.0–0.7)
Eosinophils Relative: 3.2 % (ref 0.0–5.0)
HCT: 41.4 % (ref 39.0–52.0)
Hemoglobin: 14.3 g/dL (ref 13.0–17.0)
Lymphocytes Relative: 25.8 % (ref 12.0–46.0)
Lymphs Abs: 1.8 K/uL (ref 0.7–4.0)
MCHC: 34.6 g/dL (ref 30.0–36.0)
MCV: 83.7 fl (ref 78.0–100.0)
Monocytes Absolute: 0.5 K/uL (ref 0.1–1.0)
Monocytes Relative: 7.7 % (ref 3.0–12.0)
Neutro Abs: 4.5 K/uL (ref 1.4–7.7)
Neutrophils Relative %: 62.7 % (ref 43.0–77.0)
Platelets: 333 K/uL (ref 150.0–400.0)
RBC: 4.95 Mil/uL (ref 4.22–5.81)
RDW: 13.1 % (ref 11.5–15.5)
WBC: 7.1 K/uL (ref 4.0–10.5)

## 2023-11-20 LAB — TSH: TSH: 1.65 u[IU]/mL (ref 0.35–5.50)

## 2023-11-20 NOTE — Assessment & Plan Note (Signed)
 Ghm utd Check labs  See [facility] Health Maintenance  Topic Date Due   Hepatitis C Screening  Never done   Hepatitis B Vaccines 19-59 Average Risk (1 of 3 - 19+ 3-dose series) Never done   COVID-19 Vaccine (5 - 2025-26 season) 09/10/2023   HIV Screening  12/11/2023 (Originally 09/18/1990)   DTaP/Tdap/Td (4 - Td or Tdap) 02/02/2026   Colonoscopy  11/28/2028   Influenza Vaccine  Completed   Pneumococcal Vaccine  Aged Out   HPV VACCINES  Aged Out   Meningococcal B Vaccine  Aged Out

## 2023-11-20 NOTE — Progress Notes (Signed)
 Subjective:    Patient ID: Carl Lane, male    DOB: May 05, 1975, 48 y.o.   MRN: [id number]  Chief Complaint  Patient presents with   Annual Exam    Pt states fasting     HPI Patient is in today for cpe  Discussed the use of AI scribe software for clinical note transcription with the patient, who gave verbal consent to proceed.  History of Present Illness Carl Lane is a 48 year old male who presents for a follow-up regarding physical therapy and weight management.  He reports that physical therapy has helped him learn about his posture and has increased his mobility. He uses a foam roller and stretchy bands for exercises and reports that his pain is slightly going away.  He is considering weight loss options and has contacted [facility] for assistance. He is motivated to lose weight, especially in preparation for a high adventure trip in June. He is currently not meeting the height-weight requirements but is actively working on it. He has started [employer] and has not experienced any injuries or significant difficulties.  He experiences water retention and swelling in his ankles after long hikes, which lasts for about a day and a half. He acknowledges a high salt intake and is trying to reduce it. His blood pressure is low, and he uses a pillow to elevate his legs.  He has a history of vertigo, which prevents him from participating in activities like scuba diving. He experiences motion sickness and vertigo attacks, even from watching certain movie scenes.  He recently had a spider bite, which he initially thought was a pimple. He noticed fang marks and drew a circle around it to monitor.  He had strep throat a couple of weeks ago and has not yet received a flu shot. He is open to getting one to prevent the flu.    Past Medical History:  Diagnosis Date   Abdominal bruit 09/08/2021   Abdominal pain, right lower quadrant 08/21/2006   Qualifier: Diagnosis  of  By: Tita MD, Luis     Acute right-sided low back pain with right-sided sciatica 03/12/2009   Qualifier: Diagnosis of   By: Joshua CMA, Chemira         Anxiety    ANXIETY 03/12/2009   Qualifier: Diagnosis of   By: Tish MD, Elsie      Replacing diagnoses that were inactivated after the 04/10/22 regulatory import     Arthritis    Backache 03/12/2009   Qualifier: Diagnosis of  By: Joshua CMA, Chemira     BENIGN POSITIONAL VERTIGO, HX OF 10/04/2007   Qualifier: Diagnosis of  By: Antonio DO, Keylin Ferryman     Biceps strain 01/26/2011   Chest pain 12/11/2017   Closed displaced fracture of proximal phalanx of lesser toe of right foot 01/20/2021   Flank pain 04/09/2009   Qualifier: Diagnosis of   By: Amon MD, Jose E.        Gastroesophageal reflux disease without esophagitis 05/22/2006   Generalized anxiety disorder 03/12/2009   GERD 05/22/2006   Qualifier: Diagnosis of  By: Tita MD, Sheree NOSS SYNDROME 05/21/2006   Qualifier: Diagnosis of   By: Tita MD, Luis      Replacing diagnoses that were inactivated after the 04/10/22 regulatory import     Hyperlipidemia    HYPERLIPIDEMIA 03/18/2008   Qualifier: Diagnosis of  By: Antonio ROSALEA Rockers     Hyperlipidemia LDL goal <100 05/22/2017  Injury of left toe 07/16/2012   Insomnia 03/02/2021   Left arm pain 05/22/2017   Loud snoring 03/02/2021   Mixed hyperlipidemia 03/18/2008   Obesity (BMI 30.0-34.9) 06/09/2020   Obesity due to excess calories with serious comorbidity 06/09/2020   OPEN WOUND FT NO TOE ALONE WITHOUT MENTION COMP 05/28/2009   Qualifier: Diagnosis of   By: Antonio ROSALEA Rockers      Replacing diagnoses that were inactivated after the 04/10/22 regulatory import     ORCHITIS/EPIDIDYMITIS NOS 08/21/2006   Qualifier: Diagnosis of   By: Tita MD, Luis      Replacing diagnoses that were inactivated after the 04/10/22 regulatory import     PANIC DISORDER 05/21/2006   Qualifier: Diagnosis of  By: Tita MD, Luis     Pneumonia     Preventative health care 12/10/2017   Rectal bleeding 10/10/2021   Right elbow pain 11/14/2011   Right shoulder pain 10/26/2014   Vertigo 04/03/2016   Overview:   Formatting of this note might be different from the original.   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; Frenulum surgery as child    Family History  Problem Relation Age of Onset   Atrial fibrillation Mother        treated with medication   Diabetes Mother    Emphysema Mother    COPD Mother    Hypertension Father    Hyperlipidemia Father    Heart disease Father    Lupus Sister    Other Sister        bicuspid aortic disease and had it replaced at 50   Ovarian cancer Sister    Heart attack Maternal Grandfather        died at 25   Lung cancer Paternal Grandmother    Heart disease Paternal Grandfather    Diabetes Paternal Grandfather    Other Maternal Aunt        bicuspbid aortic disease and had it replaced at 67   Heart disease Maternal Uncle        quadrupal bypass   Heart attack Cousin        maternal cousin   Sudden death Neg Hx     Social History   Socioeconomic History   Marital status: Married    Spouse name: Not on file   Number of children: 2   Years of education: Not on file   Highest education level: Associate degree: occupational, scientist, product/process development, or vocational program  Occupational History   Occupation: Tree Surgeon  Tobacco Use   Smoking status: Never   Smokeless tobacco: Never  Vaping Use   Vaping status: Never Used  Substance and Sexual Activity   Alcohol use: Yes    Comment: once a month   Drug use: No   Sexual activity: Yes    Partners: Female  Other Topics Concern   Not on file  Social History Narrative   Regular exercise- intermittent   Social Drivers of Health   Financial Resource Strain: Low Risk  (11/14/2023)   Overall Financial Resource Strain (CARDIA)     Difficulty of Paying Living Expenses: Not very hard  Food Insecurity: No Food Insecurity (11/14/2023)   Hunger Vital Sign    Worried About Running Out of Food in the Last Year: Never true    Ran Out of Food in the Last Year: Never true  Transportation  Needs: No Transportation Needs (11/14/2023)   PRAPARE - Administrator, Civil Service (Medical): No    Lack of Transportation (Non-Medical): No  Physical Activity: Insufficiently Active (11/14/2023)   Exercise Vital Sign    Days of Exercise per Week: 2 days    Minutes of Exercise per Session: 60 min  Stress: Stress Concern Present (11/14/2023)   [employer] of Occupational Health - Occupational Stress Questionnaire    Feeling of Stress: Rather much  Social Connections: Unknown (11/14/2023)   Social Connection and Isolation Panel    Frequency of Communication with Friends and Family: Twice a week    Frequency of Social Gatherings with Friends and Family: Twice a week    Attends Religious Services: 1 to 4 times per year    Active Member of [employer] or Organizations: Not on file    Attends Banker Meetings: Not on file    Marital Status: Married  Intimate Partner Violence: Low Risk (10/30/2019)   Received from [facility]   Intimate Partner Violence    Insults You: Not on file    Threatens You: Not on file    Screams at [place]: Not on file    Physically Hurt: Not on file    Intimate Partner Violence Score: Not on file    Outpatient Medications Prior to Visit  Medication Sig Dispense Refill   ALPRAZolam  (XANAX ) 1 MG tablet Take 1 mg by mouth 3 (three) times daily as needed for anxiety.     FLUoxetine (PROZAC) 40 MG capsule Take 80 mg by mouth daily.     cyclobenzaprine  (FLEXERIL ) 10 MG tablet Take 1 tablet (10 mg total) by mouth 3 (three) times daily as needed for muscle spasms. 30 tablet 1   predniSONE  (DELTASONE ) 10 MG tablet TAKE 3 TABLETS PO QD FOR 3 DAYS THEN TAKE 2 TABLETS PO QD FOR 3 DAYS THEN TAKE 1  TABLET PO QD FOR 3 DAYS THEN TAKE 1/2 TAB PO QD FOR 3 DAYS 20 tablet 0   No facility-administered medications prior to visit.    No Known Allergies  Review of Systems  Constitutional:  Negative for fever and malaise/fatigue.  HENT:  Negative for congestion.   Eyes:  Negative for blurred vision.  Respiratory:  Negative for shortness of breath.   Cardiovascular:  Negative for chest pain, palpitations and leg swelling.  Gastrointestinal:  Negative for abdominal pain, blood in stool and nausea.  Genitourinary:  Negative for dysuria and frequency.  Musculoskeletal:  Negative for falls.  Skin:  Negative for rash.  Neurological:  Negative for dizziness, loss of consciousness and headaches.  Endo/Heme/Allergies:  Negative for environmental allergies.  Psychiatric/Behavioral:  Negative for depression. The patient is not nervous/anxious.        Objective:    Physical Exam Vitals and nursing note reviewed.  Constitutional:      General: He is not in acute distress.    Appearance: Normal appearance. He is well-developed.  HENT:     Head: Normocephalic and atraumatic.     Right Ear: Tympanic membrane, ear canal and external ear normal. There is no impacted cerumen.     Left Ear: Tympanic membrane, ear canal and external ear normal. There is no impacted cerumen.     Nose: Nose normal.     Mouth/Throat:     Mouth: Mucous membranes are moist.     Pharynx: Oropharynx is clear. No oropharyngeal exudate or posterior oropharyngeal erythema.  Eyes:     General: No  scleral icterus.       Right eye: No discharge.        Left eye: No discharge.     Conjunctiva/sclera: Conjunctivae normal.     Pupils: Pupils are equal, round, and reactive to light.  Neck:     Thyroid : No thyromegaly.     Vascular: No JVD.  Cardiovascular:     Rate and Rhythm: Normal rate and regular rhythm.     Heart sounds: Normal heart sounds. No murmur heard. Pulmonary:     Effort: Pulmonary effort is normal. No  respiratory distress.     Breath sounds: Normal breath sounds.  Abdominal:     General: Bowel sounds are normal. There is no distension.     Palpations: Abdomen is soft. There is no mass.     Tenderness: There is no abdominal tenderness. There is no guarding or rebound.  Musculoskeletal:        General: Normal range of motion.     Cervical back: Normal range of motion and neck supple.     Right lower leg: No edema.     Left lower leg: No edema.  Lymphadenopathy:     Cervical: No cervical adenopathy.  Skin:    General: Skin is warm and dry.     Findings: No erythema or rash.  Neurological:     Mental Status: He is alert and oriented to person, place, and time.     Cranial Nerves: No cranial nerve deficit.     Motor: No abnormal muscle tone.     Deep Tendon Reflexes: Reflexes are normal and symmetric. Reflexes normal.  Psychiatric:        Mood and Affect: Mood normal.        Behavior: Behavior normal.        Thought Content: Thought content normal.        Judgment: Judgment normal.     BP 102/70 (BP Location: Left Arm, Patient Position: Sitting, Cuff Size: Normal)   Pulse 68   Temp 98 F (36.7 C) (Oral)   Resp 18   Ht 5' 11 (1.803 m)   Wt 251 lb 12.8 oz (114.2 kg)   SpO2 97%   BMI 35.12 kg/m  Wt Readings from Last 3 Encounters:  11/20/23 251 lb 12.8 oz (114.2 kg)  09/13/23 243 lb 1.9 oz (110.3 kg)  08/30/23 247 lb 9.6 oz (112.3 kg)    Diabetic Foot Exam - Simple   No data filed    Lab Results  Component Value Date   WBC 7.6 10/10/2021   HGB 14.4 10/10/2021   HCT 42.8 10/10/2021   PLT 278.0 10/10/2021   GLUCOSE 100 (H) 10/10/2021   CHOL 196 10/10/2021   TRIG 233.0 (H) 10/10/2021   HDL 29.30 (L) 10/10/2021   LDLDIRECT 134.0 10/10/2021   LDLCALC 113 (H) 06/03/2020   ALT 25 10/10/2021   AST 16 10/10/2021   NA 139 10/10/2021   K 4.0 10/10/2021   CL 104 10/10/2021   CREATININE 1.10 10/10/2021   BUN 17 10/10/2021   CO2 28 10/10/2021   TSH 1.90 10/10/2021    PSA 0.22 10/10/2021    Lab Results  Component Value Date   TSH 1.90 10/10/2021   Lab Results  Component Value Date   WBC 7.6 10/10/2021   HGB 14.4 10/10/2021   HCT 42.8 10/10/2021   MCV 83.9 10/10/2021   PLT 278.0 10/10/2021   Lab Results  Component Value Date   NA 139 10/10/2021  K 4.0 10/10/2021   CO2 28 10/10/2021   GLUCOSE 100 (H) 10/10/2021   BUN 17 10/10/2021   CREATININE 1.10 10/10/2021   BILITOT 1.2 10/10/2021   ALKPHOS 51 10/10/2021   AST 16 10/10/2021   ALT 25 10/10/2021   PROT 6.8 10/10/2021   ALBUMIN 4.2 10/10/2021   CALCIUM  9.4 10/10/2021   GFR 80.76 10/10/2021   Lab Results  Component Value Date   CHOL 196 10/10/2021   Lab Results  Component Value Date   HDL 29.30 (L) 10/10/2021   Lab Results  Component Value Date   LDLCALC 113 (H) 06/03/2020   Lab Results  Component Value Date   TRIG 233.0 (H) 10/10/2021   Lab Results  Component Value Date   CHOLHDL 7 10/10/2021   No results found for: HGBA1C     Assessment & Plan:  Preventative health care Assessment & Plan: Ghm utd Check labs  See AVS Health Maintenance  Topic Date Due   Hepatitis C Screening  Never done   Hepatitis B Vaccines 19-59 Average Risk (1 of 3 - 19+ 3-dose series) Never done   COVID-19 Vaccine (5 - 2025-26 season) 09/10/2023   HIV Screening  12/11/2023 (Originally 09[birth year]1990)   DTaP/Tdap/Td (4 - Td or Tdap) 02/02/2026   Colonoscopy  11/28/2028   Influenza Vaccine  Completed   Pneumococcal Vaccine  Aged Out   HPV VACCINES  Aged Out   Meningococcal B Vaccine  Aged Out     Orders: -     Lipid panel -     PSA -     TSH -     Comprehensive metabolic panel with GFR -     CBC with Differential/Platelet -     Thyroid  Panel With TSH  Need for influenza vaccination -     Flu vaccine trivalent PF, 6mos and older(Flulaval,Afluria,Fluarix,Fluzone)  Assessment and Plan Assessment & Plan Adult Wellness Visit   During his routine adult wellness visit, he reported  no new family history or surgeries. He remains active, engaging in hiking and planning a high adventure trip in June, with no orthopedic injuries. A recent calcium  score of zero indicates no coronary artery calcification. He should continue regular physical activity and weight management efforts and is encouraged to participate in the planned high adventure trip.  Immunization   He had not received a flu shot yet, and a recent strep infection increases his risk for flu complications. A flu shot was administered.  Edema of lower extremities   He experiences intermittent edema of the lower extremities, especially after long hikes or prolonged sitting, likely due to salt intake and physical activity. With low blood pressure, diuretics are not recommended. He was advised to reduce salt intake, read food labels, elevate his legs when sitting, and wear compression socks during travel to prevent swelling.  Obesity   He is actively working on weight loss through physical therapy and plans to start a weight loss program. He is interested in weight loss medications, but cost is a concern. Recent policy changes may reduce medication costs. He is encouraged to continue physical therapy and weight loss efforts, and the potential cost reduction of weight loss medications was discussed.  Benign paroxysmal vertigo   He experiences vertigo attacks, particularly triggered by visual stimuli such as movies, and avoids activities like scuba diving due to vertigo and motion sickness. He should continue to avoid activities that trigger vertigo, such as scuba diving.    Osmel Dykstra R Lowne Chase, DO

## 2023-11-20 NOTE — Patient Instructions (Signed)

## 2023-11-20 NOTE — Assessment & Plan Note (Signed)
 Pt is looking into weight loss program

## 2023-11-21 LAB — THYROID PANEL WITH TSH
Free Thyroxine Index: 2.3 (ref 1.4–3.8)
T3 Uptake: 29 % (ref 22–35)
T4, Total: 8 ug/dL (ref 4.9–10.5)
TSH: 1.6 m[IU]/L (ref 0.40–4.50)

## 2023-11-29 ENCOUNTER — Ambulatory Visit: Payer: Self-pay | Admitting: Family Medicine

## 2023-12-13 ENCOUNTER — Encounter: Payer: Self-pay | Admitting: Family Medicine

## 2023-12-20 ENCOUNTER — Encounter: Payer: Self-pay | Admitting: Family Medicine

## 2023-12-20 ENCOUNTER — Ambulatory Visit: Admitting: Family Medicine

## 2023-12-20 VITALS — BP 108/70 | HR 59 | Temp 98.0°F | Resp 16 | Ht 71.0 in | Wt 254.2 lb

## 2023-12-20 DIAGNOSIS — M79605 Pain in left leg: Secondary | ICD-10-CM | POA: Diagnosis not present

## 2023-12-20 DIAGNOSIS — M545 Low back pain, unspecified: Secondary | ICD-10-CM

## 2023-12-20 NOTE — Progress Notes (Signed)
 Subjective:    Patient ID: Carl Lane, male    DOB: 15-Dec-1975, 48 y.o.   MRN: [id number]  Chief Complaint  Patient presents with   Back Pain   Follow-up    HPI Patient is in today for back pain .  Discussed the use of AI scribe software for clinical note transcription with the patient, who gave verbal consent to proceed.  History of Present Illness Carl Lane is a 48 year old male who presents with worsening lower back pain and sciatic pain.  He describes the lower back pain as worsening, with physical therapy sometimes exacerbating the condition, leading to severe sciatic pain radiating from the hip down to the left knee and ankle. The pain does not cause numbness or tingling but feels like a 'rolled ankle' and is accompanied by a sensation of tightness in the lower back.  The sacroiliac joint area is persistently flared up, and he experiences a band of tightness in the lower back, especially when bending over. He describes a sensation of decompression in his back when bending down for extended periods. He has not had any back surgery.  The pain radiates exclusively down the left leg, and he has attempted exercises to build his core strength, which initially cause significant pain. He has stopped practicing jujitsu to aid in healing but continues to go on hikes without carrying much weight.  Twisting and bending down can trigger shooting pain and tightness, and he occasionally experiences non-painful spasms in the upper back.  He is currently taking ibuprofen, which provides some relief, and has muscle relaxers available but is hesitant to use them due to their sedative effect. He uses heat for relief but has not used ice extensively.    Past Medical History:  Diagnosis Date   Abdominal bruit 09/08/2021   Abdominal pain, right lower quadrant 08/21/2006   Qualifier: Diagnosis of  By: Tita MD, Luis     Acute right-sided low back pain with right-sided sciatica  03/12/2009   Qualifier: Diagnosis of   By: Joshua CMA, Chemira         Anxiety    ANXIETY 03/12/2009   Qualifier: Diagnosis of   By: Tish MD, Elsie      Replacing diagnoses that were inactivated after the 04/10/22 regulatory import     Arthritis    Backache 03/12/2009   Qualifier: Diagnosis of  By: Joshua CMA, Chemira     BENIGN POSITIONAL VERTIGO, HX OF 10/04/2007   Qualifier: Diagnosis of  By: Antonio DO, [address]     Biceps strain 01/26/2011   Chest pain 12/11/2017   Closed displaced fracture of proximal phalanx of lesser toe of right foot 01/20/2021   Flank pain 04/09/2009   Qualifier: Diagnosis of   By: Amon MD, Jose E.        Gastroesophageal reflux disease without esophagitis 05/22/2006   Generalized anxiety disorder 03/12/2009   GERD 05/22/2006   Qualifier: Diagnosis of  By: Tita MD, Sheree NOSS SYNDROME 05/21/2006   Qualifier: Diagnosis of   By: Tita MD, Luis      Replacing diagnoses that were inactivated after the 04/10/22 regulatory import     Hyperlipidemia    HYPERLIPIDEMIA 03/18/2008   Qualifier: Diagnosis of  By: Antonio ROSALEA Rockers     Hyperlipidemia LDL goal <100 05/22/2017   Injury of left toe 07/16/2012   Insomnia 03/02/2021   Left arm pain 05/22/2017   Loud snoring 03/02/2021   Mixed hyperlipidemia  03/18/2008   Obesity (BMI 30.0-34.9) 06/09/2020   Obesity due to excess calories with serious comorbidity 06/09/2020   OPEN WOUND FT NO TOE ALONE WITHOUT MENTION COMP 05/28/2009   Qualifier: Diagnosis of   By: Antonio ROSALEA Rockers      Replacing diagnoses that were inactivated after the 04/10/22 regulatory import     ORCHITIS/EPIDIDYMITIS NOS 08/21/2006   Qualifier: Diagnosis of   By: Tita MD, Luis      Replacing diagnoses that were inactivated after the 04/10/22 regulatory import     PANIC DISORDER 05/21/2006   Qualifier: Diagnosis of  By: Tita MD, Luis     Pneumonia    Preventative health care 12/10/2017   Rectal bleeding 10/10/2021   Right elbow pain  11/14/2011   Right shoulder pain 10/26/2014   Vertigo 04/03/2016   Overview:   Formatting of this note might be different from the original.   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; Frenulum surgery as child    Family History  Problem Relation Age of Onset   Atrial fibrillation Mother        treated with medication   Diabetes Mother    Emphysema Mother    COPD Mother    Hypertension Father    Hyperlipidemia Father    Heart disease Father    Lupus Sister    Other Sister        bicuspid aortic disease and had it replaced at 11   Ovarian cancer Sister    Heart attack Maternal Grandfather        died at 69   Lung cancer Paternal Grandmother    Heart disease Paternal Grandfather    Diabetes Paternal Grandfather    Other Maternal Aunt        bicuspbid aortic disease and had it replaced at 40   Heart disease Maternal Uncle        quadrupal bypass   Heart attack Cousin        maternal cousin   Sudden death Neg Hx     Social History   Socioeconomic History   Marital status: Married    Spouse name: Not on file   Number of children: 2   Years of education: Not on file   Highest education level: Associate degree: occupational, scientist, product/process development, or vocational program  Occupational History   Occupation: Tree Surgeon  Tobacco Use   Smoking status: Never   Smokeless tobacco: Never  Vaping Use   Vaping status: Never Used  Substance and Sexual Activity   Alcohol use: Yes    Comment: once a month   Drug use: No   Sexual activity: Yes    Partners: Female  Other Topics Concern   Not on file  Social History Narrative   Regular exercise- intermittent   Social Drivers of Health   Tobacco Use: Low Risk (12/20/2023)   Patient History    Smoking Tobacco Use: Never    Smokeless Tobacco Use: Never    Passive Exposure: Not on file  Financial Resource Strain: Low  Risk (11/14/2023)   Overall Financial Resource Strain (CARDIA)    Difficulty of Paying Living Expenses: Not very hard  Food Insecurity: No Food Insecurity (11/14/2023)   Epic    Worried About Radiation Protection Practitioner of Food in the Last Year: Never true    Ran Out of Food in the Last  Year: Never true  Transportation Needs: No Transportation Needs (11/14/2023)   Epic    Lack of Transportation (Medical): No    Lack of Transportation (Non-Medical): No  Physical Activity: Insufficiently Active (11/14/2023)   Exercise Vital Sign    Days of Exercise per Week: 2 days    Minutes of Exercise per Session: 60 min  Stress: Stress Concern Present (11/14/2023)   [employer] of Occupational Health - Occupational Stress Questionnaire    Feeling of Stress: Rather much  Social Connections: Unknown (11/14/2023)   Social Connection and Isolation Panel    Frequency of Communication with Friends and Family: Twice a week    Frequency of Social Gatherings with Friends and Family: Twice a week    Attends Religious Services: 1 to 4 times per year    Active Member of [employer] or Organizations: Not on file    Attends Banker Meetings: Not on file    Marital Status: Married  Intimate Partner Violence: Not on file  Depression (PHQ2-9): Low Risk (08/30/2023)   Depression (PHQ2-9)    PHQ-2 Score: 0  Alcohol Screen: Low Risk (11/14/2023)   Alcohol Screen    Last Alcohol Screening Score (AUDIT): 1  Housing: Unknown (11/14/2023)   Epic    Unable to Pay for Housing in the Last Year: No    Number of Times Moved in the Last Year: Not on file    Homeless in the Last Year: No  Utilities: Not on file  Health Literacy: Not on file    Outpatient Medications Prior to Visit  Medication Sig Dispense Refill   ALPRAZolam  (XANAX ) 1 MG tablet Take 1 mg by mouth 3 (three) times daily as needed for anxiety.     FLUoxetine (PROZAC) 40 MG capsule Take 80 mg by mouth daily.     No facility-administered medications prior to visit.     Allergies[1]  Review of Systems  Constitutional:  Negative for chills, fever and malaise/fatigue.  HENT:  Negative for congestion and hearing loss.   Eyes:  Negative for discharge.  Respiratory:  Negative for cough, sputum production and shortness of breath.   Cardiovascular:  Negative for chest pain, palpitations and leg swelling.  Gastrointestinal:  Negative for abdominal pain, blood in stool, constipation, diarrhea, heartburn, nausea and vomiting.  Genitourinary:  Negative for dysuria, frequency, hematuria and urgency.  Musculoskeletal:  Positive for back pain. Negative for falls and myalgias.  Skin:  Negative for rash.  Neurological:  Negative for dizziness, sensory change, loss of consciousness, weakness and headaches.  Endo/Heme/Allergies:  Negative for environmental allergies. Does not bruise/bleed easily.  Psychiatric/Behavioral:  Negative for depression and suicidal ideas. The patient is not nervous/anxious and does not have insomnia.        Objective:    Physical Exam Vitals and nursing note reviewed.  Constitutional:      General: He is not in acute distress.    Appearance: Normal appearance. He is well-developed.  HENT:     Head: Normocephalic and atraumatic.  Eyes:     General: No scleral icterus.       Right eye: No discharge.        Left eye: No discharge.  Cardiovascular:     Rate and Rhythm: Normal rate and regular rhythm.     Heart sounds: No murmur heard. Pulmonary:     Effort: Pulmonary effort is normal. No respiratory distress.     Breath sounds: Normal breath sounds.  Musculoskeletal:  General: Tenderness present. Normal range of motion.     Cervical back: Normal range of motion and neck supple.     Right lower leg: No edema.     Left lower leg: No edema.  Skin:    General: Skin is warm and dry.  Neurological:     Mental Status: He is alert and oriented to person, place, and time.     Motor: No weakness.     Coordination: Coordination  normal.     Gait: Gait normal.     Deep Tendon Reflexes: Reflexes normal.  Psychiatric:        Mood and Affect: Mood normal.        Behavior: Behavior normal.        Thought Content: Thought content normal.        Judgment: Judgment normal.     BP 108/70 (BP Location: Left Arm, Patient Position: Sitting, Cuff Size: Large)   Pulse (!) 59   Temp 98 F (36.7 C) (Oral)   Resp 16   Ht 5' 11 (1.803 m)   Wt 254 lb 3.2 oz (115.3 kg)   SpO2 95%   BMI 35.45 kg/m  Wt Readings from Last 3 Encounters:  12/20/23 254 lb 3.2 oz (115.3 kg)  11/20/23 251 lb 12.8 oz (114.2 kg)  09/13/23 243 lb 1.9 oz (110.3 kg)    Diabetic Foot Exam - Simple   No data filed    Lab Results  Component Value Date   WBC 7.1 11/20/2023   HGB 14.3 11/20/2023   HCT 41.4 11/20/2023   PLT 333.0 11/20/2023   GLUCOSE 98 11/20/2023   CHOL 190 11/20/2023   TRIG 181.0 (H) 11/20/2023   HDL 28.60 (L) 11/20/2023   LDLDIRECT 134.0 10/10/2021   LDLCALC 125 (H) 11/20/2023   ALT 22 11/20/2023   AST 17 11/20/2023   NA 139 11/20/2023   K 4.6 11/20/2023   CL 103 11/20/2023   CREATININE 0.98 11/20/2023   BUN 15 11/20/2023   CO2 29 11/20/2023   TSH 1.65 11/20/2023   TSH 1.60 11/20/2023   PSA 0.27 11/20/2023    Lab Results  Component Value Date   TSH 1.65 11/20/2023   TSH 1.60 11/20/2023   Lab Results  Component Value Date   WBC 7.1 11/20/2023   HGB 14.3 11/20/2023   HCT 41.4 11/20/2023   MCV 83.7 11/20/2023   PLT 333.0 11/20/2023   Lab Results  Component Value Date   NA 139 11/20/2023   K 4.6 11/20/2023   CO2 29 11/20/2023   GLUCOSE 98 11/20/2023   BUN 15 11/20/2023   CREATININE 0.98 11/20/2023   BILITOT 0.8 11/20/2023   ALKPHOS 45 11/20/2023   AST 17 11/20/2023   ALT 22 11/20/2023   PROT 6.8 11/20/2023   ALBUMIN 4.1 11/20/2023   CALCIUM  9.3 11/20/2023   GFR 91.40 11/20/2023   Lab Results  Component Value Date   CHOL 190 11/20/2023   Lab Results  Component Value Date   HDL 28.60 (L)  11/20/2023   Lab Results  Component Value Date   LDLCALC 125 (H) 11/20/2023   Lab Results  Component Value Date   TRIG 181.0 (H) 11/20/2023   Lab Results  Component Value Date   CHOLHDL 7 11/20/2023   No results found for: HGBA1C     Assessment & Plan:  Low back pain radiating to left leg -     MR LUMBAR SPINE WO CONTRAST; Future  Assessment and Plan Assessment &  Plan Low back pain with left-sided sciatica   Chronic low back pain with left-sided sciatica is exacerbated by physical therapy. Pain radiates from the lower back to the left hip, knee, and ankle, without numbness or tingling. It is associated with tightness and a sensation of decompression in the lower back, especially when bending over. There is no history of back surgery. Previous x-rays and physical therapy have not provided significant relief. Differential includes SI joint dysfunction. An MRI is ordered to further evaluate the condition. Continue ibuprofen and acetaminophen for pain management. Use heat and ice therapy alternately for pain relief. Consider muscle relaxers at night if needed for muscle spasms. Monitor symptoms and report any changes.    Deshanta Lady R Lowne Chase, DO     [1] No Known Allergies

## 2024-01-06 ENCOUNTER — Ambulatory Visit
Admission: RE | Admit: 2024-01-06 | Discharge: 2024-01-06 | Disposition: A | Source: Ambulatory Visit | Attending: Family Medicine | Admitting: Family Medicine

## 2024-01-06 DIAGNOSIS — M545 Low back pain, unspecified: Secondary | ICD-10-CM

## 2024-01-15 ENCOUNTER — Ambulatory Visit: Payer: Self-pay | Admitting: Family Medicine

## 2024-01-15 DIAGNOSIS — M545 Low back pain, unspecified: Secondary | ICD-10-CM

## 2024-01-18 ENCOUNTER — Other Ambulatory Visit: Payer: Self-pay | Admitting: Family Medicine

## 2024-01-18 DIAGNOSIS — M545 Low back pain, unspecified: Secondary | ICD-10-CM
